# Patient Record
Sex: Female | Born: 1990 | Marital: Single | Smoking: Former smoker
Health system: Southern US, Community
[De-identification: ages and names within clinical notes are randomized; demographics above are authoritative.]

## PROBLEM LIST (undated history)

## (undated) DIAGNOSIS — R87629 Unspecified abnormal cytological findings in specimens from vagina: Secondary | ICD-10-CM

## (undated) HISTORY — PX: NO PAST SURGERIES: SHX2092

---

## 2012-05-03 DIAGNOSIS — R87629 Unspecified abnormal cytological findings in specimens from vagina: Secondary | ICD-10-CM

## 2012-05-03 HISTORY — DX: Unspecified abnormal cytological findings in specimens from vagina: R87.629

## 2012-12-28 ENCOUNTER — Ambulatory Visit (INDEPENDENT_AMBULATORY_CARE_PROVIDER_SITE_OTHER): Payer: BC Managed Care – PPO | Admitting: Family Medicine

## 2012-12-28 VITALS — BP 98/66 | HR 91 | Temp 98.9°F | Resp 18 | Ht 60.0 in | Wt 98.0 lb

## 2012-12-28 DIAGNOSIS — Z0000 Encounter for general adult medical examination without abnormal findings: Secondary | ICD-10-CM

## 2012-12-28 DIAGNOSIS — N390 Urinary tract infection, site not specified: Secondary | ICD-10-CM

## 2012-12-28 DIAGNOSIS — IMO0002 Reserved for concepts with insufficient information to code with codable children: Secondary | ICD-10-CM

## 2012-12-28 DIAGNOSIS — Z3009 Encounter for other general counseling and advice on contraception: Secondary | ICD-10-CM

## 2012-12-28 LAB — POCT CBC
Hemoglobin: 15.2 g/dL (ref 12.2–16.2)
Lymph, poc: 4.2 — AB (ref 0.6–3.4)
MCH, POC: 28.1 pg (ref 27–31.2)
MCHC: 31.5 g/dL — AB (ref 31.8–35.4)
MCV: 89.1 fL (ref 80–97)
MID (cbc): 0.8 (ref 0–0.9)
RBC: 5.41 M/uL (ref 4.04–5.48)
WBC: 10.8 10*3/uL — AB (ref 4.6–10.2)

## 2012-12-28 LAB — POCT URINALYSIS DIPSTICK
Blood, UA: NEGATIVE
Glucose, UA: NEGATIVE
Ketones, UA: NEGATIVE
Spec Grav, UA: 1.025

## 2012-12-28 LAB — COMPREHENSIVE METABOLIC PANEL
ALT: 11 U/L (ref 0–35)
Albumin: 4.7 g/dL (ref 3.5–5.2)
CO2: 26 mEq/L (ref 19–32)
Calcium: 9.8 mg/dL (ref 8.4–10.5)
Chloride: 99 mEq/L (ref 96–112)
Potassium: 3.5 mEq/L (ref 3.5–5.3)
Sodium: 139 mEq/L (ref 135–145)
Total Protein: 8.7 g/dL — ABNORMAL HIGH (ref 6.0–8.3)

## 2012-12-28 MED ORDER — NORGESTIM-ETH ESTRAD TRIPHASIC 0.18/0.215/0.25 MG-25 MCG PO TABS: 1.0000 | ORAL_TABLET | Freq: Every day | ORAL | Status: DC

## 2012-12-28 NOTE — Progress Notes (Signed)
Subjective:    Patient ID: Kathy Johnston, female    DOB: 1991-04-27, 22 y.o.   MRN: [id number] Chief Complaint  Patient presents with  . Annual Exam    pap    HPI  This is her first pap smear.  Is not birth control but is sexually active.  Using condoms occasionally.  5 partners in past year and about 10 in her lifetime.   Has been on OCPs in the past but not good about remembering to take them.  Doesn't like needles so - despite tattoos - really doesn't want to get Depo.  Has not tried setting an alarm on her phone with the pill. Last STD test was a yr ago. NO vitamins or supplement. Has had all 3 HPV vaccines.  History reviewed. No pertinent past medical history. No current outpatient prescriptions on file prior to visit.   No current facility-administered medications on file prior to visit.   No Known Allergies History reviewed. No pertinent past surgical history. History reviewed. No pertinent family history. History   Social History  . Marital Status: Single    Spouse Name: N/A    Number of Children: N/A  . Years of Education: N/A   Social History Main Topics  . Smoking status: Never Smoker   . Smokeless tobacco: None  . Alcohol Use: Yes  . Drug Use: No  . Sexual Activity: None   Other Topics Concern  . None   Social History Narrative  . None       Review of Systems  Constitutional: Negative for fever, chills, diaphoresis, activity change, appetite change, fatigue and unexpected weight change.  Gastrointestinal: Negative for abdominal pain, diarrhea, constipation, blood in stool, anal bleeding and rectal pain.  Genitourinary: Negative for dysuria, urgency, frequency, hematuria, decreased urine volume, vaginal bleeding, vaginal discharge, difficulty urinating, genital sores, vaginal pain, menstrual problem, pelvic pain and dyspareunia.  Musculoskeletal: Negative for gait problem.  Skin: Negative for rash.  Hematological: Negative for adenopathy.   Psychiatric/Behavioral: The patient is not nervous/anxious.       BP 98/66  Pulse 91  Temp(Src) 98.9 F (37.2 C) (Oral)  Resp 18  Ht 5' (1.524 m)  Wt 98 lb (44.453 kg)  BMI 19.14 kg/m2  SpO2 99%  LMP 12/22/2012 Objective:   Physical Exam  Constitutional: She is oriented to person, place, and time. She appears well-developed and well-nourished. No distress.  HENT:  Head: Normocephalic and atraumatic.  Right Ear: Tympanic membrane, external ear and ear canal normal.  Left Ear: Tympanic membrane, external ear and ear canal normal.  Nose: Mucosal edema present. No rhinorrhea.  Mouth/Throat: Uvula is midline, oropharynx is clear and moist and mucous membranes are normal. No posterior oropharyngeal erythema.  Eyes: Conjunctivae and EOM are normal. Pupils are equal, round, and reactive to light. Right eye exhibits no discharge. Left eye exhibits no discharge. No scleral icterus.  Neck: Normal range of motion. Neck supple. No thyromegaly present.  Cardiovascular: Normal rate, regular rhythm, normal heart sounds and intact distal pulses.   Pulmonary/Chest: Effort normal and breath sounds normal. No respiratory distress.  Abdominal: Soft. Bowel sounds are normal. There is no tenderness.  Genitourinary: Vagina normal and uterus normal. No breast swelling, tenderness, discharge or bleeding. Cervix exhibits no motion tenderness and no friability. Right adnexum displays no mass and no tenderness. Left adnexum displays no mass and no tenderness.  Musculoskeletal: She exhibits no edema.  Lymphadenopathy:    She has no cervical adenopathy.  Neurological: She is  alert and oriented to person, place, and time. She has normal reflexes.  Skin: Skin is warm and dry. She is not diaphoretic. No erythema.  Psychiatric: She has a normal mood and affect. Her behavior is normal.          Results for orders placed in visit on 12/28/12  POCT CBC      Result Value Range   WBC 10.8 (*) 4.6 - 10.2 K/uL    Lymph, poc 4.2 (*) 0.6 - 3.4   POC LYMPH PERCENT 38.8  10 - 50 %L   MID (cbc) 0.8  0 - 0.9   POC MID % 7.5  0 - 12 %M   POC Granulocyte 5.8  2 - 6.9   Granulocyte percent 53.7  37 - 80 %G   RBC 5.41  4.04 - 5.48 M/uL   Hemoglobin 15.2  12.2 - 16.2 g/dL   HCT, POC 11.9 (*) 14.7 - 47.9 %   MCV 89.1  80 - 97 fL   MCH, POC 28.1  27 - 31.2 pg   MCHC 31.5 (*) 31.8 - 35.4 g/dL   RDW, POC 82.9     Platelet Count, POC 249  142 - 424 K/uL   MPV 9.9  0 - 99.8 fL  POCT URINE PREGNANCY      Result Value Range   Preg Test, Ur Negative    POCT URINALYSIS DIPSTICK      Result Value Range   Color, UA yellow     Clarity, UA cloudy     Glucose, UA neg     Bilirubin, UA neg     Ketones, UA neg     Spec Grav, UA 1.025     Blood, UA neg     pH, UA 7.0     Protein, UA neg     Urobilinogen, UA 0.2     Nitrite, UA positive     Leukocytes, UA Trace      Assessment & Plan:  Routine general medical examination at a health care facility - Plan: Pap IG, CT/NG w/ reflex HPV when ASC-U, POCT CBC, POCT urinalysis dipstick, POCT Wet Prep with KOH, TSH, Comprehensive metabolic panel, HIV antibody, RPR, Hepatitis C antibody, HSV(herpes simplex vrs) 1+2 ab-IgG, CANCELED: POCT UA - Microscopic Only  Family planning - Plan: POCT urine pregnancy - will try pills though pt concerned about her poor compliance she is not open to IUD, Depo, or Nexplanon at this time.  UTI (urinary tract infection) - Plan: POCT UA - Microscopic Only, Urine culture  Meds ordered this encounter  Medications  . Norgestimate-Ethinyl Estradiol Triphasic (ORTHO TRI-CYCLEN LO) 0.18/0.215/0.25 MG-25 MCG tab    Sig: Take 1 tablet by mouth daily.    Dispense:  1 Package    Refill:  11

## 2012-12-28 NOTE — Patient Instructions (Signed)
Keeping You Healthy  Get These Tests 1. Blood Pressure- Have your blood pressure checked once a year by your health care provider.  Normal blood pressure is 120/80. 2. Weight- Have your body mass index (BMI) calculated to screen for obesity.  BMI is measure of body fat based on height and weight.  You can also calculate your own BMI at [email or web address]-[email or web address] 3. Cholesterol- Have your cholesterol checked every 5 years starting at age 23 then yearly starting at age 2. 4. Chlamydia, HIV, and other sexually transmitted diseases- Get screened every year until age 45, then within three months of each new sexual provider. 5. Pap Smear- Every 1-3 years; discuss with your health care provider. 6. Mammogram- Every year starting at age 30  Take these medicines  Calcium with Vitamin D-Your body needs 1200 mg of Calcium each day and [phone] IU of Vitamin D daily.  Your body can only absorb 500 mg of Calcium at a time so Calcium must be taken in 2 or 3 divided doses throughout the day.  Multivitamin with folic acid- Once daily if it is possible for you to become pregnant.  Get these Immunizations  Gardasil-Series of three doses; prevents HPV related illness such as genital warts and cervical cancer.  Menactra-Single dose; prevents meningitis.  Tetanus shot- Every 10 years.  Flu shot-Every year.  Take these steps 1. Do not smoke-Your healthcare provider can help you quit.  For tips on how to quit go to www.smokefree.gov or call 1-800 QUITNOW. 2. Be physically active- Exercise 5 days a week for at least 30 minutes.  If you are not already physically active, start slow and gradually work up to 30 minutes of moderate physical activity.  Examples of moderate activity include walking briskly, dancing, swimming, bicycling, etc. 3. Breast Cancer- A self breast exam every month is important for early detection of breast cancer.  For more information and instruction on self breast exams, ask your  healthcare provider or [email or web address]. 4. Eat a healthy diet- Eat a variety of healthy foods such as fruits, vegetables, whole grains, low fat milk, low fat cheeses, yogurt, lean meats, poultry and fish, beans, nuts, tofu, etc.  For more information go to www. Thenutritionsource.org 5. Drink alcohol in moderation- Limit alcohol intake to one drink or less per day. Never drink and drive. 6. Depression- Your emotional health is as important as your physical health.  If you're feeling down or losing interest in things you normally enjoy please talk to your healthcare provider about being screened for depression. 7. Dental visit- Brush and floss your teeth twice daily; visit your dentist twice a year. 8. Eye doctor- Get an eye exam at least every 2 years. 9. Helmet use- Always wear a helmet when riding a bicycle, motorcycle, rollerblading or skateboarding. 10. Safe sex- If you may be exposed to sexually transmitted infections, use a condom. 11. Seat belts- Seat belts can save your live; always wear one. 12. Smoke/Carbon Monoxide detectors- These detectors need to be installed on the appropriate level of your home. Replace batteries at least once a year. 13. Skin cancer- When out in the sun please cover up and use sunscreen 15 SPF or higher. 14. Violence- If anyone is threatening or hurting you, please tell your healthcare provider.         Contraception Choices Birth control (contraception) can stop pregnancy from happening. Different types of birth control work in different ways. Some can:  Make the mucus in the cervix thick. This makes it  hard for sperm to get into the uterus.  Thin the lining of the uterus. This makes it hard for an egg to attach to the wall of the uterus.  Stop the ovaries from releasing an egg.  Block the sperm from reaching the egg. Certain types of surgery can stop pregnancy from happening. For women, the sugery closes the fallopian  tubes (tubal ligation). For men, the surgery stops sperm from releasing during sex (vasectomy). HORMONAL BIRTH CONTROL Hormonal birth control stops pregnancy by putting hormones into your body. Types of birth control include:  A small tube put under the skin of the upper arm (implant). The tube can stay in place for 3 years.  Shots given every 3 months.  Pills taken every day or once after sex (intercourse).  Patches that are changed once a week.  A ring put into the vagina (vaginal ring). The ring is left in place for 3 weeks and removed for 1 week. Then, a new ring is put in the vagina. BARRIER BIRTH CONTROL  Barrier birth control blocks sperm from reaching the egg. Types of birth control include:   A thin covering worn on the penis (female condom) during sex.  A soft, loose covering put into the vagina (female condom) before sex.  A rubber bowl that sits over the cervix (diaphragm). The bowl must be made for you. The bowl is put into the vagina before sex. The bowl is left in place for 6 to 8 hours after sex.  A small, soft cup that fits over the cervix (cervical cap). The cup must be made for you. The cup can be left in place for 48 hours after sex.  A sponge that is put into the vagina before sex.  A chemical that kills or blocks sperm from getting into the cervix and uterus (spermicide). The chemical may be a cream, jelly, foam, or pill. INTRAUTERINE (IUD) BIRTH CONTROL  IUD birth control is a small, T-shaped piece of plastic. The plastic is put inside the uterus. There are 2 types of IUD:  Copper IUD. The IUD is covered in copper wire. The copper makes a fluid that kills sperm. It can stay in place for 10 years.  Hormone IUD. The hormone stops pregnancy from happening. It can stay in place for 5 years. NATURAL FAMILY PLANNING BIRTH CONTROL  Natural family planning means not having sex or using barrier birth control when the woman is fertile. A woman can:  Use a calendar to  keep track of when she is fertile.  Use a thermometer to measure her body temperature. Protect yourself against sexual diseases no matter what type of birth control you use. Talk to your doctor about which type of birth control is best for you. Document Released: 02/14/2009 Document Revised: 07/12/2011 Document Reviewed: 08/26/2010 [facility] Patient Information 2014 [place], [place]. Oral Contraception Information Oral contraceptives (OCs) are medicines taken to prevent pregnancy. OCs work by preventing the ovaries from releasing eggs. The hormones in OCs also cause the cervical mucus to thicken, preventing the sperm from entering the uterus. The hormones also cause the uterine lining to become thin, not allowing a fertilized egg to attach to the inside of the uterus. OCs are highly effective when taken exactly as prescribed. However, OCs do not prevent sexually transmitted diseases (STDs). Safe sex practices, such as using condoms along with the pill, can help prevent STDs.  Before taking the pill, you may have a physical exam and Pap test. Your caregiver may order  blood tests that may be necessary. Your caregiver will make sure you are a good candidate for oral contraception. Discuss with your caregiver the possible side effects of the OC you may be prescribed. When starting an OC, it can take 2 to 3 months for the body to adjust to the changes in hormone levels in your body.  TYPES OF ORAL CONTRACEPTION  The combination pill. This pill contains estrogen and progestin (synthetic progesterone) hormones. The combination pill comes in either 21-day or 28-day packs. With 21-day packs, you do not take pills for 7 days after the last pill. With 28-day packs, the pill is taken every day. The last 7 pills are without hormones. Certain types of pills have more than 21 hormone-containing pills.  The minipill. This pill contains the progesterone hormone only. It is taken every day continuously. The minipill comes  in packs of 91 pills. The first 84 pills contain the hormones, and the last 7 pills do not. The last 7 days are when you will have your menstrual period. You may experience irregular spotting. ADVANTAGES  Decreases premenstrual symptoms.  Treats menstrual period cramps.  Regulates the menstrual cycle.  Decreases a heavy menstrual flow.  Treats acne.  Treats abnormal uterine bleeding.  Treats chronic pelvic pain.  Treats polycystic ovarian syndrome.  Treats endometriosis.  Can be used as emergency contraception. DISADVANTAGES OCs can be less effective if:  You forget to take the pill at the same time every day.  You have a stomach or intestinal disease that lessens the absorption of the pill.  You take OCs with other medicines that make OCs less effective.  You take expired OCs.  You forget to restart the pill on day 7, when using the packs of 21 pills. Document Released: 07/10/2002 Document Revised: 07/12/2011 Document Reviewed: 08/26/2010 [facility] Patient Information 2014 [place], [place]. Oral Contraception Use Oral contraceptives (OCs) are medicines taken to prevent pregnancy. OCs work by preventing the ovaries from releasing eggs. The hormones in OCs also cause the cervical mucus to thicken, preventing the sperm from entering the uterus. The hormones also cause the uterine lining to become thin, not allowing a fertilized egg to attach to the inside of the uterus. OCs are highly effective when taken exactly as prescribed. However, OCs do not prevent sexually transmitted diseases (STDs). Safe sex practices, such as using condoms along with an OC, can help prevent STDs.  Before taking OCs, you may have a physical exam and Pap test. Your caregiver may also order blood tests if necessary. Your caregiver will make sure you are a good candidate for oral contraception. Discuss with your caregiver the possible side effects of the OC you may be prescribed. When starting an OC, it can  take 2 to 3 months for the body to adjust to the changes in hormone levels in your body.  HOW TO TAKE ORAL CONTRACEPTIVES Your caregiver may advise you on how to start taking the first cycle of OCs. Otherwise, you can:  Start on day 1 of your menstrual period. You will not need any backup contraceptive protection with this start time.  Start on the first Sunday after your menstrual period or the day you get your prescription. In these cases, you will need to use backup contraceptive protection for the first 7-day cycle. After you have started taking OCs:  If you forget to take 1 pill, take it as soon as you remember. Take the next pill at the regular time.  If you miss 2  or more pills, use backup birth control until your next menstrual period starts.  If you use a 28-day pack that contains inactive pills and you miss 1 of the last 7 pills (pills with no hormones), it will not matter. Throw away the rest of the non-hormone pills and start a new pill pack. No matter which day you start the OC, you will always start a new pack on that same day of the week. Have an extra pack of OCs and a backup contraceptive method available in case you miss some pills or lose your OC pack. HOME CARE INSTRUCTIONS   Do not smoke.  Always use a condom to protect against STDs. OCs do not protect against STDs.  Use a calendar to mark your menstrual period days.  Read the information and directions that come with your OC. Talk to your caregiver if you have questions. SEEK MEDICAL CARE IF:   You develop nausea and vomiting.  You have abnormal vaginal discharge or bleeding.  You develop a rash.  You miss your menstrual period.  You are losing your hair.  You need treatment for mood swings or depression.  You get dizzy when taking the OC.  You develop acne from taking the OC.  You become pregnant. SEEK IMMEDIATE MEDICAL CARE IF:   You develop chest pain.  You develop shortness of breath.  You  have an uncontrolled or severe headache.  You develop numbness or slurred speech.  You develop visual problems.  You develop pain, redness, and swelling in the legs. Document Released: 04/08/2011 Document Revised: 07/12/2011 Document Reviewed: 04/08/2011 [facility] Patient Information 2014 [place], [place].

## 2012-12-29 LAB — HIV ANTIBODY (ROUTINE TESTING W REFLEX): HIV: NONREACTIVE

## 2012-12-29 LAB — HSV(HERPES SIMPLEX VRS) I + II AB-IGG
HSV 1 Glycoprotein G Ab, IgG: 0.4 IV
HSV 2 Glycoprotein G Ab, IgG: <0.1 IV

## 2012-12-29 LAB — TSH: TSH: 3.018 u[IU]/mL (ref 0.350–4.500)

## 2012-12-29 LAB — HEPATITIS C ANTIBODY: HCV Ab: NEGATIVE

## 2012-12-30 MED ORDER — NITROFURANTOIN MONOHYD MACRO 100 MG PO CAPS: 100.0000 mg | ORAL_CAPSULE | Freq: Two times a day (BID) | ORAL | Status: DC

## 2012-12-30 NOTE — Addendum Note (Signed)
Addended by: Norberto Sorenson on: 12/30/2012 12:46 PM   Modules accepted: Orders

## 2013-01-03 DIAGNOSIS — IMO0002 Reserved for concepts with insufficient information to code with codable children: Secondary | ICD-10-CM | POA: Insufficient documentation

## 2013-01-03 LAB — PAP IG, CT-NG, RFX HPV ASCU: GC Probe Amp: NEGATIVE

## 2013-04-02 ENCOUNTER — Ambulatory Visit (INDEPENDENT_AMBULATORY_CARE_PROVIDER_SITE_OTHER): Payer: BC Managed Care – PPO | Admitting: *Deleted

## 2013-04-02 DIAGNOSIS — O3680X0 Pregnancy with inconclusive fetal viability, not applicable or unspecified: Secondary | ICD-10-CM

## 2013-04-02 DIAGNOSIS — Z3201 Encounter for pregnancy test, result positive: Secondary | ICD-10-CM

## 2013-04-02 NOTE — Progress Notes (Signed)
Pt is undecided what to do concerning pregnancy,  Ultrasound scheduled for 12/4 at 1415.  Pt aware.

## 2013-04-02 NOTE — Progress Notes (Signed)
Pt

## 2013-04-05 ENCOUNTER — Ambulatory Visit (HOSPITAL_COMMUNITY): Payer: BC Managed Care – PPO

## 2013-05-21 ENCOUNTER — Inpatient Hospital Stay (HOSPITAL_COMMUNITY): Payer: BC Managed Care – PPO

## 2013-05-21 ENCOUNTER — Inpatient Hospital Stay (HOSPITAL_COMMUNITY)
Admission: AD | Admit: 2013-05-21 | Discharge: 2013-05-21 | Disposition: A | Discharge status: Home / Self Care | Payer: BC Managed Care – PPO | Source: Ambulatory Visit | Attending: Obstetrics & Gynecology | Admitting: Obstetrics & Gynecology

## 2013-05-21 ENCOUNTER — Encounter (HOSPITAL_COMMUNITY): Payer: Self-pay

## 2013-05-21 DIAGNOSIS — R102 Pelvic and perineal pain: Secondary | ICD-10-CM

## 2013-05-21 DIAGNOSIS — O99891 Other specified diseases and conditions complicating pregnancy: Secondary | ICD-10-CM | POA: Insufficient documentation

## 2013-05-21 DIAGNOSIS — O9989 Other specified diseases and conditions complicating pregnancy, childbirth and the puerperium: Principal | ICD-10-CM

## 2013-05-21 DIAGNOSIS — N949 Unspecified condition associated with female genital organs and menstrual cycle: Secondary | ICD-10-CM | POA: Insufficient documentation

## 2013-05-21 DIAGNOSIS — O26859 Spotting complicating pregnancy, unspecified trimester: Secondary | ICD-10-CM | POA: Insufficient documentation

## 2013-05-21 DIAGNOSIS — O26899 Other specified pregnancy related conditions, unspecified trimester: Secondary | ICD-10-CM

## 2013-05-21 DIAGNOSIS — R109 Unspecified abdominal pain: Secondary | ICD-10-CM | POA: Insufficient documentation

## 2013-05-21 HISTORY — DX: Unspecified abnormal cytological findings in specimens from vagina: R87.629

## 2013-05-21 LAB — CBC
HCT: 39.9 % (ref 36.0–46.0)
HEMOGLOBIN: 13.4 g/dL (ref 12.0–15.0)
MCH: 27.9 pg (ref 26.0–34.0)
MCHC: 33.6 g/dL (ref 30.0–36.0)
MCV: 83 fL (ref 78.0–100.0)
Platelets: 169 10*3/uL (ref 150–400)
RBC: 4.81 MIL/uL (ref 3.87–5.11)
RDW: 14.1 % (ref 11.5–15.5)
WBC: 10.2 10*3/uL (ref 4.0–10.5)

## 2013-05-21 LAB — TYPE AND SCREEN
ABO/RH(D): O POS
ANTIBODY SCREEN: NEGATIVE

## 2013-05-21 LAB — HCG, QUANTITATIVE, PREGNANCY: HCG, BETA CHAIN, QUANT, S: 6538 m[IU]/mL — AB (ref <5)

## 2013-05-21 LAB — ABO/RH: ABO/RH(D): O POS

## 2013-05-21 NOTE — Discharge Instructions (Signed)
Ectopic Pregnancy °An ectopic pregnancy is when the fertilized egg attaches (implants) outside the uterus. Most ectopic pregnancies occur in the fallopian tube. Rarely do ectopic pregnancies occur on the ovary, intestine, pelvis, or cervix. In an ectopic pregnancy, the fertilized egg does not have the ability to develop into a normal, healthy baby.  °A ruptured ectopic pregnancy is one in which the fallopian tube gets torn or bursts and results in internal bleeding. Often there is intense abdominal pain, and sometimes, vaginal bleeding. Having an ectopic pregnancy can be life threatening. If left untreated, this dangerous condition can lead to a blood transfusion, abdominal surgery, or even death. °CAUSES  °Damage to the fallopian tubes is the suspected cause in most ectopic pregnancies.  °RISK FACTORS °Depending on your circumstances, the risk of having an ectopic pregnancy will vary. The level of risk can be divided into three categories. °High Risk °· You have gone through infertility treatment. °· You have had a previous ectopic pregnancy. °· You have had previous tubal surgery. °· You have had previous surgery to have the fallopian tubes tied (tubal ligation). °· You have tubal problems or diseases. °· You have been exposed to DES. DES is a medicine that was used until 1971 and had effects on babies whose mothers took the medicine. °· You become pregnant while using an intrauterine device (IUD) for birth control.  °Moderate Risk °· You have a history of infertility. °· You have a history of a sexually transmitted infection (STI). °· You have a history of pelvic inflammatory disease (PID). °· You have scarring from endometriosis. °· You have multiple sexual partners. °· You smoke.  °Low Risk °· You have had previous pelvic surgery. °· You use vaginal douching. °· You became sexually active before 24 years of age. °SIGNS AND SYMPTOMS  °An ectopic pregnancy should be suspected in anyone who has missed a period and  has abdominal pain or bleeding. °· You may experience normal pregnancy symptoms, such as: °· Nausea. °· Tiredness. °· Breast tenderness. °· Other symptoms may include: °· Pain with intercourse. °· Irregular vaginal bleeding or spotting. °· Cramping or pain on one side or in the lower abdomen. °· Fast heartbeat. °· Passing out while having a bowel movement. °· Symptoms of a ruptured ectopic pregnancy and internal bleeding may include: °· Sudden, severe pain in the abdomen and pelvis. °· Dizziness or fainting. °· Pain in the shoulder area. °DIAGNOSIS  °Tests that may be performed include: °· A pregnancy test. °· An ultrasound test. °· Testing the specific level of pregnancy hormone in the bloodstream. °· Taking a sample of uterus tissue (dilation and curettage, D&C). °· Surgery to perform a visual exam of the inside of the abdomen using a thin, lighted tube with a tiny camera on the end (laparoscope). °TREATMENT  °An injection of a medicine called methotrexate may be given. This medicine causes the pregnancy tissue to be absorbed. It is given if: °· The diagnosis is made early. °· The fallopian tube has not ruptured. °· You are considered to be a good candidate for the medicine. °Usually, pregnancy hormone blood levels are checked after methotrexate treatment. This is to be sure the medicine is effective. It may take 4 6 weeks for the pregnancy to be absorbed (though most pregnancies will be absorbed by 3 weeks). °Surgical treatment may be needed. A laparoscope may be used to remove the pregnancy tissue. If severe internal bleeding occurs, a cut (incision) may be made in the lower abdomen (laparotomy), and the   ectopic pregnancy is removed. This stops the bleeding. Part of the fallopian tube, or the whole tube, may be removed as well (salpingectomy). After surgery, pregnancy hormone tests may be done to be sure there is no pregnancy tissue left. You may receive an Rho(D) immune globulin shot if you are Rh negative and  the father is Rh positive, or if you do not know the Rh type of the father. This is to prevent problems with any future pregnancy. °SEEK IMMEDIATE MEDICAL CARE IF:  °You have any symptoms of an ectopic pregnancy. This is a medical emergency. °Document Released: 05/27/2004 Document Revised: 02/07/2013 Document Reviewed: 11/16/2012 °[employer]® Patient Information ©2014 [employer]. ° °

## 2013-05-21 NOTE — MAU Note (Signed)
Bright red vaginal bleeding & lower abdominal cramping since just before midnight tonight. States has been getting prenatal care at [facility] department, last seen 2 weeks ago.

## 2013-05-21 NOTE — MAU Provider Note (Signed)
History     CSN: [id number]  Arrival date and time: 05/21/13 0358   None     Chief Complaint  Patient presents with  . Vaginal Bleeding  . Abdominal Pain   Vaginal Bleeding Associated symptoms include abdominal pain.  Abdominal Pain    Kathy Johnston is a 23 y.o. G1P0 at [redacted]w[redacted]d who presents today with cramping. She states that she has had some bleeding. She states it was spotting for the last two days and then at 0000 she started to have heavier bleeding. She states that the bleeding was like a period, but she had passed some clots. She denies seeing any tissue. She was seen at the HD about 2 weeks ago, and they were unable to get FHT. She was told that she was too early at that time.   Past Medical History  Diagnosis Date  . Vaginal Pap smear, abnormal 2014    Past Surgical History  Procedure Laterality Date  . No past surgeries      History reviewed. No pertinent family history.  History  Substance Use Topics  . Smoking status: Never Smoker   . Smokeless tobacco: Not on file  . Alcohol Use: No    Allergies: No Known Allergies  Prescriptions prior to admission  Medication Sig Dispense Refill  . acetaminophen (TYLENOL) 500 MG tablet Take 500 mg by mouth every 6 (six) hours as needed for moderate pain.      . Prenatal Vit-Fe Fumarate-FA (PRENATAL MULTIVITAMIN) TABS tablet Take 1 tablet by mouth daily at 12 noon.        Review of Systems  Gastrointestinal: Positive for abdominal pain.  Genitourinary: Positive for vaginal bleeding.   Physical Exam   Blood pressure 105/63, pulse 100, temperature 99.5 F (37.5 C), temperature source Oral, resp. rate 18, height 5\' 1"  (1.549 m), weight 46.72 kg (103 lb), last menstrual period 12/22/2012, SpO2 100.00%.  Physical Exam  Nursing note and vitals reviewed. Constitutional: She is oriented to person, place, and time. She appears well-developed and well-nourished. No distress.  Cardiovascular: Normal rate.    Respiratory: Effort normal.  GI: Soft. There is no tenderness.  Neurological: She is alert and oriented to person, place, and time.  Skin: Skin is warm and dry.  Psychiatric: She has a normal mood and affect.   Unable to auscultate FHT with doppler.   MAU Course  Procedures  Results for orders placed during the hospital encounter of 05/21/13 (from the past 24 hour(s))  CBC     Status: None   Collection Time    05/21/13  4:25 AM      Result Value Range   WBC 10.2  4.0 - 10.5 K/uL   RBC 4.81  3.87 - 5.11 MIL/uL   Hemoglobin 13.4  12.0 - 15.0 g/dL   HCT 40.9  81.1 - 91.4 %   MCV 83.0  78.0 - 100.0 fL   MCH 27.9  26.0 - 34.0 pg   MCHC 33.6  30.0 - 36.0 g/dL   RDW 78.2  95.6 - 21.3 %   Platelets 169  150 - 400 K/uL  TYPE AND SCREEN     Status: None   Collection Time    05/21/13  4:25 AM      Result Value Range   ABO/RH(D) O POS     Antibody Screen NEG     Sample Expiration 05/24/2013    HCG, QUANTITATIVE, PREGNANCY     Status: Abnormal   Collection Time  05/21/13  4:25 AM      Result Value Range   hCG, Beta Chain, Quant, S 6538 (*) <5 mIU/mL  ABO/RH     Status: None   Collection Time    05/21/13  4:25 AM      Result Value Range   ABO/RH(D) O POS       US Ob Comp Less 14 Wks  05/21/2013   CLINICAL DATA:  Vaginal bleeding and pelvic pain.  EXAM: OBSTETRIC <14 WK [place] AND TRANSVAGINAL OB US  TECHNIQUE: Both transabdominal and transvaginal ultrasound examinations were performed for complete evaluation of the gestation as well as the maternal uterus, adnexal regions, and pelvic cul-de-sac. Transvaginal technique was performed to assess early pregnancy.  COMPARISON:  None.  FINDINGS: Intrauterine gestational sac: None seen.  Yolk sac:  N/A  Embryo:  N/A  Maternal uterus/adnexae: The endometrial echo complex appears somewhat thickened and heterogeneous, with clumping of echogenic tissue at the fundus. This may simply reflect clot, as no definite associated blood flow is seen on  limited Doppler evaluation.  The ovaries are unremarkable in appearance. The right ovary measures 1.9 x 1.6 x 1.8 cm, while the left ovary measures 3.0 x 1.1 x 1.7 cm. No suspicious adnexal masses are seen; there is no evidence for ovarian torsion.  No free fluid is seen within the pelvic cul-de-sac.  IMPRESSION: No intrauterine gestational sac seen. The endometrial echo complex appears somewhat thickened and heterogeneous, with clumping of echogenic tissue at the fundus. This may simply reflect clot, as no associated blood flow is seen. If the patient's symptoms persist, further evaluation could be considered.  Would follow-up quantitative beta HCG levels; if levels continue to rise, follow-up pelvic ultrasound would be warranted.   Electronically Signed   By: Roanna Raider M.D.   On: 05/21/2013 05:22   US Ob Transvaginal  05/21/2013   CLINICAL DATA:  Vaginal bleeding and pelvic pain.  EXAM: OBSTETRIC <14 WK [place] AND TRANSVAGINAL OB US  TECHNIQUE: Both transabdominal and transvaginal ultrasound examinations were performed for complete evaluation of the gestation as well as the maternal uterus, adnexal regions, and pelvic cul-de-sac. Transvaginal technique was performed to assess early pregnancy.  COMPARISON:  None.  FINDINGS: Intrauterine gestational sac: None seen.  Yolk sac:  N/A  Embryo:  N/A  Maternal uterus/adnexae: The endometrial echo complex appears somewhat thickened and heterogeneous, with clumping of echogenic tissue at the fundus. This may simply reflect clot, as no definite associated blood flow is seen on limited Doppler evaluation.  The ovaries are unremarkable in appearance. The right ovary measures 1.9 x 1.6 x 1.8 cm, while the left ovary measures 3.0 x 1.1 x 1.7 cm. No suspicious adnexal masses are seen; there is no evidence for ovarian torsion.  No free fluid is seen within the pelvic cul-de-sac.  IMPRESSION: No intrauterine gestational sac seen. The endometrial echo complex appears somewhat  thickened and heterogeneous, with clumping of echogenic tissue at the fundus. This may simply reflect clot, as no associated blood flow is seen. If the patient's symptoms persist, further evaluation could be considered.  Would follow-up quantitative beta HCG levels; if levels continue to rise, follow-up pelvic ultrasound would be warranted.   Electronically Signed   By: Roanna Raider M.D.   On: 05/21/2013 05:22     Assessment and Plan   1. Pelvic pain complicating pregnancy    Cannot exclude ectopic at this time Bleeding precautions Ectopic precautions  Follow-up Information   [facility] In 2 days.   Contact information:   [address] [id number] [place] [id number] [phone]       Tawnya CrookHogan, Jacobs Golab Donovan 05/21/2013, 5:25 AM

## 2013-05-23 ENCOUNTER — Inpatient Hospital Stay (HOSPITAL_COMMUNITY)
Admission: AD | Admit: 2013-05-23 | Discharge: 2013-05-23 | Disposition: A | Discharge status: Home / Self Care | Payer: BC Managed Care – PPO | Source: Ambulatory Visit | Attending: Obstetrics and Gynecology | Admitting: Obstetrics and Gynecology

## 2013-05-23 DIAGNOSIS — O039 Complete or unspecified spontaneous abortion without complication: Secondary | ICD-10-CM

## 2013-05-23 LAB — HCG, QUANTITATIVE, PREGNANCY: hCG, Beta Chain, Quant, S: 1708 m[IU]/mL — ABNORMAL HIGH (ref <5)

## 2013-05-23 NOTE — Discharge Instructions (Signed)
Incomplete Miscarriage °A miscarriage is the sudden loss of an unborn baby (fetus) before the 20th week of pregnancy. In an incomplete miscarriage, parts of the fetus or placenta (afterbirth) remain in the body.  °Having a miscarriage can be an emotional experience. Talk with your health care provider about any questions you may have about miscarrying, the grieving process, and your future pregnancy plans. °CAUSES  °· Problems with the fetal chromosomes that make it impossible for the baby to develop normally. Problems with the baby's genes or chromosomes are most often the result of errors that occur by chance as the embryo divides and grows. The problems are not inherited from the parents. °· Infection of the cervix or uterus. °· Hormone problems. °· Problems with the cervix, such as having an incompetent cervix. This is when the tissue in the cervix is not strong enough to hold the pregnancy. °· Problems with the uterus, such as an abnormally shaped uterus, uterine fibroids, or congenital abnormalities. °· Certain medical conditions. °· Smoking, drinking alcohol, or taking illegal drugs. °· Trauma. °SYMPTOMS  °· Vaginal bleeding or spotting, with or without cramps or pain. °· Pain or cramping in the abdomen or lower back. °· Passing fluid, tissue, or blood clots from the vagina. °DIAGNOSIS  °Your health care provider will perform a physical exam. You may also have an ultrasound to confirm the miscarriage. Blood or urine tests may also be ordered. °TREATMENT  °· Usually, a dilation and curettage (D&C) procedure is performed. During a D&C procedure, the cervix is widened (dilated) and any remaining fetal or placental tissue is gently removed from the uterus. °· Antibiotic medicines are prescribed if there is an infection. Other medicines may be given to reduce the size of the uterus (contract) if there is a lot of bleeding. °· If you have Rh negative blood and your baby was Rh positive, you will need an Rho(D)  immune globulin shot. This shot will protect any future baby from having Rh blood problems in future pregnancies. °· You may be confined to bed rest. This means you should stay in bed and only get up to use the bathroom. °HOME CARE INSTRUCTIONS  °· Rest as directed by your health care provider. °· Restrict activity as directed by your health care provider. You may be allowed to continue light activity if curettage was not done but you require further treatment. °· Keep track of the number of pads you use each day. Keep track of how soaked (saturated) they are. Record this information. °· Do not  use tampons. °· Do not douche or have sexual intercourse until approved by your health care provider. °· Keep all follow-up appointments for re-evaluation and continuing management. °· Only take over-the-counter or prescription medicines for pain, fever, or discomfort as directed by your health care provider. °· Take antibiotic medicine as directed by your health care provider. Make sure you finish it even if you start to feel better. °SEEK IMMEDIATE MEDICAL CARE IF:  °· You experience severe cramps in your stomach, back, or abdomen. °· You have an unexplained temperature (make sure to record these temperatures). °· You pass large clots or tissue (save these for your health care provider to inspect). °· Your bleeding increases. °· You become light-headed, weak, or have fainting episodes. °MAKE SURE YOU:  °· Understand these instructions. °· Will watch your condition. °· Will get help right away if you are not doing well or get worse. °Document Released: 04/19/2005 Document Revised: 02/07/2013 Document Reviewed: 11/16/2012 °  ExitCare® Patient Information ©2014 [employer]. ° °

## 2013-05-23 NOTE — MAU Provider Note (Signed)
Kathy Johnston is a 23 y.o. G1P0 at 2475w0d who presents to MAU today for follow-up quant hCG. The patient was seen on 05/21/13 with complaint of heavy vaginal bleeding. US did not show anything in the uterus at that time. Light bleeding today and no abdominal pain. Patient felt she may have passed POC on Monday after her visit in MAU.   BP 98/56  Pulse 97  Temp(Src) 98.1 F (36.7 C) (Oral)  Resp 16  LMP 12/22/2012 GENERAL: Well-developed, well-nourished female in no acute distress.  HEENT: Normocephalic, atraumatic.   LUNGS: Effort normal HEART: Regular rate  SKIN: Warm, dry and without erythema PSYCH: Normal mood and affect  Results for orders placed during the hospital encounter of 05/23/13 (from the past 24 hour(s))  HCG, QUANTITATIVE, PREGNANCY     Status: Abnormal   Collection Time    05/23/13  9:14 AM      Result Value Range   hCG, Beta Chain, Quant, S 1708 (*) <5 mIU/mL   Results for Jamelle HaringXAYYAVONG, Monicka (MRN [id number]) as of 05/23/2013 10:13  Ref. Range 04/02/2013 15:46 05/21/2013 04:25 05/21/2013 04:25 05/21/2013 05:13 05/23/2013 09:14  hCG, Beta Chain, Quant, S Latest Range: <5 mIU/mL  6538 (H)   1708 (H)   MDM Patient is stable at this time Patient declines pain medication Rx at this time.  Patient brought sample of "tissue" passed at home. Does appear to have some elements of POC.   A: SAB  P: Discharge home Bleeding precautions discussed [facility]. They will call her with an appointment Patient may return to MAU as needed  Freddi StarrJulie N Ethier, PA-C 05/23/2013 10:13 AM

## 2013-05-23 NOTE — MAU Note (Signed)
Pt here for F/U labs, denies pain, is having light bleeding today.

## 2013-05-23 NOTE — MAU Provider Note (Signed)
Attestation of Attending Supervision of Advanced Practitioner (CNM/NP): Evaluation and management procedures were performed by the Advanced Practitioner under my supervision and collaboration.  I have reviewed the Advanced Practitioner's note and chart, and I agree with the management and plan.  Domenico Achord 05/23/2013 1:10 PM   

## 2013-06-06 ENCOUNTER — Ambulatory Visit (INDEPENDENT_AMBULATORY_CARE_PROVIDER_SITE_OTHER): Payer: BC Managed Care – PPO | Admitting: Obstetrics & Gynecology

## 2013-06-06 ENCOUNTER — Encounter: Payer: Self-pay | Admitting: Obstetrics & Gynecology

## 2013-06-06 VITALS — BP 106/70 | HR 88 | Temp 97.8°F | Ht 61.0 in | Wt 101.7 lb

## 2013-06-06 DIAGNOSIS — O039 Complete or unspecified spontaneous abortion without complication: Secondary | ICD-10-CM

## 2013-06-06 NOTE — Progress Notes (Signed)
Pt. States she stopped bleeding 05/31/13. Has not had any bleeding, pain or problems since then.

## 2013-06-06 NOTE — Patient Instructions (Signed)
Miscarriage A miscarriage is the sudden loss of an unborn baby (fetus) before the 20th week of pregnancy. Most miscarriages happen in the first 3 months of pregnancy. Sometimes, it happens before a woman even knows she is pregnant. A miscarriage is also called a "spontaneous miscarriage" or "early pregnancy loss." Having a miscarriage can be an emotional experience. Talk with your caregiver about any questions you may have about miscarrying, the grieving process, and your future pregnancy plans. CAUSES   Problems with the fetal chromosomes that make it impossible for the baby to develop normally. Problems with the baby's genes or chromosomes are most often the result of errors that occur, by chance, as the embryo divides and grows. The problems are not inherited from the parents.  Infection of the cervix or uterus.   Hormone problems.   Problems with the cervix, such as having an incompetent cervix. This is when the tissue in the cervix is not strong enough to hold the pregnancy.   Problems with the uterus, such as an abnormally shaped uterus, uterine fibroids, or congenital abnormalities.   Certain medical conditions.   Smoking, drinking alcohol, or taking illegal drugs.   Trauma.  Often, the cause of a miscarriage is unknown.  SYMPTOMS   Vaginal bleeding or spotting, with or without cramps or pain.  Pain or cramping in the abdomen or lower back.  Passing fluid, tissue, or blood clots from the vagina. DIAGNOSIS  Your caregiver will perform a physical exam. You may also have an ultrasound to confirm the miscarriage. Blood or urine tests may also be ordered. TREATMENT   Sometimes, treatment is not necessary if you naturally pass all the fetal tissue that was in the uterus. If some of the fetus or placenta remains in the body (incomplete miscarriage), tissue left behind may become infected and must be removed. Usually, a dilation and curettage (D and C) procedure is performed.  During a D and C procedure, the cervix is widened (dilated) and any remaining fetal or placental tissue is gently removed from the uterus.  Antibiotic medicines are prescribed if there is an infection. Other medicines may be given to reduce the size of the uterus (contract) if there is a lot of bleeding.  If you have Rh negative blood and your baby was Rh positive, you will need a Rh immunoglobulin shot. This shot will protect any future baby from having Rh blood problems in future pregnancies. HOME CARE INSTRUCTIONS   Your caregiver may order bed rest or may allow you to continue light activity. Resume activity as directed by your caregiver.  Have someone help with home and family responsibilities during this time.   Keep track of the number of sanitary pads you use each day and how soaked (saturated) they are. Write down this information.   Do not use tampons. Do not douche or have sexual intercourse until approved by your caregiver.   Only take over-the-counter or prescription medicines for pain or discomfort as directed by your caregiver.   Do not take aspirin. Aspirin can cause bleeding.   Keep all follow-up appointments with your caregiver.   If you or your partner have problems with grieving, talk to your caregiver or seek counseling to help cope with the pregnancy loss. Allow enough time to grieve before trying to get pregnant again.  SEEK IMMEDIATE MEDICAL CARE IF:   You have severe cramps or pain in your back or abdomen.  You have a fever.  You pass large blood clots (walnut-sized   or larger) ortissue from your vagina. Save any tissue for your caregiver to inspect.   Your bleeding increases.   You have a thick, bad-smelling vaginal discharge.  You become lightheaded, weak, or you faint.   You have chills.  MAKE SURE YOU:  Understand these instructions.  Will watch your condition.  Will get help right away if you are not doing well or get  worse. Document Released: 10/13/2000 Document Revised: 08/14/2012 Document Reviewed: 06/08/2011 ExitCare Patient Information 2014 [employer].  

## 2013-06-06 NOTE — Progress Notes (Signed)
Subjective:     Patient ID: Kathy HaringNatthaya Lipari, female   DOB: 05/22/1990, 23 y.o.   MRN: [id number]  HPI  Pt here to f/u after an SAB. She denies current bleeding. This was an undesried pregnancy.  Declines birth control.  Partner incarcerated.   Review of Systems     Objective:   Physical Exam BP 106/70  Pulse 88  Temp(Src) 97.8 F (36.6 C) (Oral)  Ht 5\' 1"  (1.549 m)  Wt 101 lb 11.2 oz (46.131 kg)  BMI 19.23 kg/m2  LMP 12/22/2012  Breastfeeding? Unknown Pt in NAD Abd: soft, NT, ND  qHCG 05/23/2013  1708 Assessment:     S/p SAB.  Still with high quant.  Will repeat.  Declines contraception       Plan:     quant hcg   F/u prn

## 2013-06-07 LAB — HCG, QUANTITATIVE, PREGNANCY: hCG, Beta Chain, Quant, S: 54.5 m[IU]/mL

## 2013-06-08 ENCOUNTER — Encounter: Payer: Self-pay | Admitting: Obstetrics & Gynecology

## 2014-01-06 ENCOUNTER — Emergency Department: Payer: Self-pay | Admitting: Emergency Medicine

## 2014-01-16 ENCOUNTER — Encounter (HOSPITAL_COMMUNITY): Payer: Self-pay

## 2014-03-04 ENCOUNTER — Encounter (HOSPITAL_COMMUNITY): Payer: Self-pay

## 2014-03-26 ENCOUNTER — Encounter (HOSPITAL_COMMUNITY): Payer: Self-pay | Admitting: *Deleted

## 2014-04-17 ENCOUNTER — Ambulatory Visit (INDEPENDENT_AMBULATORY_CARE_PROVIDER_SITE_OTHER): Payer: BC Managed Care – PPO

## 2014-04-17 ENCOUNTER — Ambulatory Visit (INDEPENDENT_AMBULATORY_CARE_PROVIDER_SITE_OTHER): Payer: BC Managed Care – PPO | Admitting: Family Medicine

## 2014-04-17 VITALS — BP 108/60 | HR 81 | Temp 98.7°F | Resp 18 | Ht 62.0 in | Wt 97.0 lb

## 2014-04-17 DIAGNOSIS — K5900 Constipation, unspecified: Secondary | ICD-10-CM

## 2014-04-17 DIAGNOSIS — R1084 Generalized abdominal pain: Secondary | ICD-10-CM

## 2014-04-17 DIAGNOSIS — Z7251 High risk heterosexual behavior: Secondary | ICD-10-CM

## 2014-04-17 DIAGNOSIS — N926 Irregular menstruation, unspecified: Secondary | ICD-10-CM

## 2014-04-17 DIAGNOSIS — N910 Primary amenorrhea: Secondary | ICD-10-CM

## 2014-04-17 LAB — POCT CBC
Granulocyte percent: 60.9 %G (ref 37–80)
HCT, POC: 40.7 % (ref 37.7–47.9)
Hemoglobin: 12.9 g/dL (ref 12.2–16.2)
Lymph, poc: 2.4 (ref 0.6–3.4)
MCH, POC: 26.8 pg — AB (ref 27–31.2)
MCHC: 31.7 g/dL — AB (ref 31.8–35.4)
MCV: 84.5 fL (ref 80–97)
MID (cbc): 0.5 (ref 0–0.9)
MPV: 8.8 fL (ref 0–99.8)
POC GRANULOCYTE: 4.5 (ref 2–6.9)
POC LYMPH %: 32 % (ref 10–50)
POC MID %: 7.1 %M (ref 0–12)
Platelet Count, POC: 146 10*3/uL (ref 142–424)
RBC: 4.81 M/uL (ref 4.04–5.48)
RDW, POC: 15.2 %
WBC: 7.4 10*3/uL (ref 4.6–10.2)

## 2014-04-17 LAB — POCT URINE PREGNANCY: Preg Test, Ur: NEGATIVE

## 2014-04-17 MED ORDER — SENNOSIDES-DOCUSATE SODIUM 8.6-50 MG PO TABS: 1.0000 | ORAL_TABLET | Freq: Two times a day (BID) | ORAL | Status: DC

## 2014-04-17 NOTE — Addendum Note (Signed)
Addended by: Wallis BambergMANI, Darryll Raju on: 04/17/2014 02:03 PM   Modules accepted: Level of Service

## 2014-04-17 NOTE — Progress Notes (Signed)
MRN: [id number] DOB: 01/15/1991  Subjective:   Kathy Johnston is  a 23 y.o. female presenting for 4 day history of generalized abdominal pain. Patient states that the pain is moderate, sharp and achy, lasting several hours, without any known precipitants. She has tried using Midol without relief. Admits feeling constipated for the same time period, has hard stools, strains, feels pain with defecation, sees some blood on the toilet paper but not in the stool, tries to go 2x a day but isn't always successful. She has tried Gas X and ginger ale without any relief. Denies history of problems with constipation or hemorrhoids. She has not changed her diet recently, eats "asian food", rice, chicken, vegetables. Denies feeling masses or protrusions around anal area. Denies diarrhea, n/v, fevers, shob, chest pain, dysuria, vaginal discharge, pelvic pain, rashes (genital or otherwise). Of note, patient is sexually active with 1 female partner, does not use protection, last time at the beginning of November, last LMP was 03/13/2014, was regular. Reports that her cycle is late, admits that abdominal pain is different from her normal menstrual cramps. Denies smoking, ~4 alcohol drinks per week. Denies any other aggravating or relieving factors, no other questions or concerns.  Kathy Johnston has a current medication list which includes the following prescription(s): senna-docusate.  She has No Known Allergies.  Kathy Johnston  has a past medical history of Vaginal Pap smear, abnormal (2014). Also  has past surgical history that includes No past surgeries.  ROS As in subjective.  Objective:   Vitals: BP 108/60 mmHg  Pulse 81  Temp(Src) 98.7 F (37.1 C) (Oral)  Resp 18  Ht 5\' 2"  (1.575 m)  Wt 97 lb (43.999 kg)  BMI 17.74 kg/m2  SpO2 100%  LMP 03/19/2014  Physical Exam  Constitutional: She is oriented to person, place, and time and well-developed, well-nourished, and in no distress.  Cardiovascular: Normal  rate, regular rhythm, normal heart sounds and intact distal pulses.  Exam reveals no gallop and no friction rub.   No murmur heard. Pulmonary/Chest: Effort normal and breath sounds normal. No respiratory distress. She has no wheezes. She has no rales. She exhibits no tenderness.  Abdominal: Soft. Bowel sounds are normal. She exhibits no distension and no mass. There is tenderness (left-sided). There is no rebound and no guarding.  Musculoskeletal: Normal range of motion. She exhibits no edema or tenderness.  Neurological: She is alert and oriented to person, place, and time.  Skin: Skin is warm and dry. No rash noted. She is not diaphoretic. No erythema.  Psychiatric: Mood and affect normal.   Results for orders placed or performed in visit on 04/17/14 (from the past 24 hour(s))  POCT urine pregnancy     Status: None   Collection Time: 04/17/14 11:57 AM  Result Value Ref Range   Preg Test, Ur Negative   POCT CBC     Status: Abnormal   Collection Time: 04/17/14 12:36 PM  Result Value Ref Range   WBC 7.4 4.6 - 10.2 K/uL   Lymph, poc 2.4 0.6 - 3.4   POC LYMPH PERCENT 32.0 10 - 50 %L   MID (cbc) 0.5 0 - 0.9   POC MID % 7.1 0 - 12 %M   POC Granulocyte 4.5 2 - 6.9   Granulocyte percent 60.9 37 - 80 %G   RBC 4.81 4.04 - 5.48 M/uL   Hemoglobin 12.9 12.2 - 16.2 g/dL   HCT, POC 08.640.7 57.837.7 - 47.9 %   MCV 84.5 80 -  97 fL   MCH, POC 26.8 (A) 27 - 31.2 pg   MCHC 31.7 (A) 31.8 - 35.4 g/dL   RDW, POC 11.915.2 %   Platelet Count, POC 146 142 - 424 K/uL   MPV 8.8 0 - 99.8 fL   [facility] reading (PRIMARY) by  Dr. Patsy Lageropland and PA-Matt Delpizzo. Abd X-ray: Evidence of mild to moderate stool burden. No other acute abnormality.  Dg Abd 1 View  04/17/2014   CLINICAL DATA:  Generalized abdominal pain.  EXAM: ABDOMEN - 1 VIEW  COMPARISON:  None.  FINDINGS: The bowel gas pattern is unremarkable. A moderate stool burden in the colon is present. No free extraluminal gas collections are demonstrated. There are no abnormal soft  tissue calcifications. The bony structures are unremarkable. There is a spina bifida occulta at S1.  IMPRESSION: Mildly increased stool burden could reflect clinical constipation but no acute intra-abdominal abnormality is demonstrated.   Electronically Signed   By: David  [place]az[place]   On: 04/17/2014 12:26   Assessment and Plan :   1. Constipation, unspecified constipation type - Advised increased water and fiber intake, patient declined rectal exam to evaluate for hemorrhoids. States that if symptoms do not improve with dietary changes, stool softener and laxative, she will return for further evaluation - senna-docusate (SENOKOT-S) 8.6-50 MG per tablet; Take 1 tablet by mouth 2 (two) times daily.  Dispense: 30 tablet; Refill: 1  2. Generalized abdominal pain 3. Menstrual period late - Likely related to mild-moderate constipation - CBC, abd x-ray reassuring for r/o infectious process - POCT urine pregnancy was negative, may be start of 1 irregular cycle - GC/Chlamydia Probe Amp pending - Comprehensive metabolic panel pending  4. High risk sexual behavior - GC/Chlamydia Probe Amp pending   Wallis BambergMario Sharada Albornoz, PA-C [facility] [phone] 04/17/2014 1:56 PM

## 2014-04-17 NOTE — Patient Instructions (Addendum)
- Start taking 2 stool softener doses a day. If your stool becomes loose, then back down to 1 dose. If your stools are still loose then come off the medicine all together. - You may also use Miralax, a laxative to help with constipation. - Increase your fiber intake including salads and vegetables, drink at least 64 ounces of water daily.   Constipation Constipation is when a person has fewer than three bowel movements a week, has difficulty having a bowel movement, or has stools that are dry, hard, or larger than normal. As people grow older, constipation is more common. If you try to fix constipation with medicines that make you have a bowel movement (laxatives), the problem may get worse. Long-term laxative use may cause the muscles of the colon to become weak. A low-fiber diet, not taking in enough fluids, and taking certain medicines may make constipation worse.  CAUSES   Certain medicines, such as antidepressants, pain medicine, iron supplements, antacids, and water pills.   Certain diseases, such as diabetes, irritable bowel syndrome (IBS), thyroid disease, or depression.   Not drinking enough water.   Not eating enough fiber-rich foods.   Stress or travel.   Lack of physical activity or exercise.   Ignoring the urge to have a bowel movement.   Using laxatives too much.  SIGNS AND SYMPTOMS   Having fewer than three bowel movements a week.   Straining to have a bowel movement.   Having stools that are hard, dry, or larger than normal.   Feeling full or bloated.   Pain in the lower abdomen.   Not feeling relief after having a bowel movement.  DIAGNOSIS  Your health care provider will take a medical history and perform a physical exam. Further testing may be done for severe constipation. Some tests may include:  A barium enema X-ray to examine your rectum, colon, and, sometimes, your small intestine.   A sigmoidoscopy to examine your lower colon.   A  colonoscopy to examine your entire colon. TREATMENT  Treatment will depend on the severity of your constipation and what is causing it. Some dietary treatments include drinking more fluids and eating more fiber-rich foods. Lifestyle treatments may include regular exercise. If these diet and lifestyle recommendations do not help, your health care provider may recommend taking over-the-counter laxative medicines to help you have bowel movements. Prescription medicines may be prescribed if over-the-counter medicines do not work.  HOME CARE INSTRUCTIONS   Eat foods that have a lot of fiber, such as fruits, vegetables, whole grains, and beans.  Limit foods high in fat and processed sugars, such as french fries, hamburgers, cookies, candies, and soda.   A fiber supplement may be added to your diet if you cannot get enough fiber from foods.   Drink enough fluids to keep your urine clear or pale yellow.   Exercise regularly or as directed by your health care provider.   Go to the restroom when you have the urge to go. Do not hold it.   Only take over-the-counter or prescription medicines as directed by your health care provider. Do not take other medicines for constipation without talking to your health care provider first.  SEEK IMMEDIATE MEDICAL CARE IF:   You have bright red blood in your stool.   Your constipation lasts for more than 4 days or gets worse.   You have abdominal or rectal pain.   You have thin, pencil-like stools.   You have unexplained weight loss. MAKE  SURE YOU:   Understand these instructions.  Will watch your condition.  Will get help right away if you are not doing well or get worse. Document Released: 01/16/2004 Document Revised: 04/24/2013 Document Reviewed: 01/29/2013 [facility] Patient Information 2015 [place], [place]. This information is not intended to replace advice given to you by your health care provider. Make sure you discuss any questions you  have with your health care provider.

## 2014-04-18 ENCOUNTER — Encounter: Payer: Self-pay | Admitting: Urgent Care

## 2014-04-18 LAB — COMPREHENSIVE METABOLIC PANEL
ALK PHOS: 53 U/L (ref 39–117)
AST: 15 U/L (ref 0–37)
Albumin: 4.2 g/dL (ref 3.5–5.2)
BUN: 8 mg/dL (ref 6–23)
CO2: 24 mEq/L (ref 19–32)
Calcium: 9.1 mg/dL (ref 8.4–10.5)
Chloride: 104 mEq/L (ref 96–112)
Creat: 0.65 mg/dL (ref 0.50–1.10)
GLUCOSE: 78 mg/dL (ref 70–99)
Potassium: 3.9 mEq/L (ref 3.5–5.3)
SODIUM: 139 meq/L (ref 135–145)
Total Bilirubin: 0.5 mg/dL (ref 0.2–1.2)
Total Protein: 7.2 g/dL (ref 6.0–8.3)

## 2014-04-18 LAB — GC/CHLAMYDIA PROBE AMP
CT Probe RNA: NEGATIVE
GC Probe RNA: NEGATIVE

## 2014-06-13 ENCOUNTER — Ambulatory Visit (INDEPENDENT_AMBULATORY_CARE_PROVIDER_SITE_OTHER): Payer: BLUE CROSS/BLUE SHIELD | Admitting: Urgent Care

## 2014-06-13 VITALS — BP 90/56 | HR 88 | Temp 98.4°F | Resp 16 | Ht 61.0 in | Wt 96.0 lb

## 2014-06-13 DIAGNOSIS — A5900 Urogenital trichomoniasis, unspecified: Secondary | ICD-10-CM

## 2014-06-13 DIAGNOSIS — N898 Other specified noninflammatory disorders of vagina: Secondary | ICD-10-CM

## 2014-06-13 DIAGNOSIS — Z0000 Encounter for general adult medical examination without abnormal findings: Secondary | ICD-10-CM

## 2014-06-13 DIAGNOSIS — Z7251 High risk heterosexual behavior: Secondary | ICD-10-CM

## 2014-06-13 DIAGNOSIS — R319 Hematuria, unspecified: Secondary | ICD-10-CM

## 2014-06-13 DIAGNOSIS — Z23 Encounter for immunization: Secondary | ICD-10-CM

## 2014-06-13 DIAGNOSIS — Z8742 Personal history of other diseases of the female genital tract: Secondary | ICD-10-CM

## 2014-06-13 DIAGNOSIS — N398 Other specified disorders of urinary system: Secondary | ICD-10-CM

## 2014-06-13 DIAGNOSIS — A5901 Trichomonal vulvovaginitis: Secondary | ICD-10-CM

## 2014-06-13 LAB — POCT UA - MICROSCOPIC ONLY
Bacteria, U Microscopic: NEGATIVE
Casts, Ur, LPF, POC: NEGATIVE
Crystals, Ur, HPF, POC: NEGATIVE
Mucus, UA: NEGATIVE
RBC, URINE, MICROSCOPIC: NEGATIVE
Yeast, UA: NEGATIVE

## 2014-06-13 LAB — POCT URINALYSIS DIPSTICK
Bilirubin, UA: NEGATIVE
Glucose, UA: NEGATIVE
Ketones, UA: NEGATIVE
Leukocytes, UA: NEGATIVE
Nitrite, UA: NEGATIVE
SPEC GRAV UA: 1.02
Urobilinogen, UA: 0.2
pH, UA: 7

## 2014-06-13 LAB — COMPREHENSIVE METABOLIC PANEL
ALK PHOS: 50 U/L (ref 39–117)
ALT: 10 U/L (ref 0–35)
AST: 14 U/L (ref 0–37)
Albumin: 4.4 g/dL (ref 3.5–5.2)
BILIRUBIN TOTAL: 0.7 mg/dL (ref 0.2–1.2)
BUN: 15 mg/dL (ref 6–23)
CO2: 29 meq/L (ref 19–32)
CREATININE: 0.87 mg/dL (ref 0.50–1.10)
Calcium: 9.1 mg/dL (ref 8.4–10.5)
Chloride: 102 mEq/L (ref 96–112)
Glucose, Bld: 62 mg/dL — ABNORMAL LOW (ref 70–99)
Potassium: 3.9 mEq/L (ref 3.5–5.3)
Sodium: 138 mEq/L (ref 135–145)
Total Protein: 7.6 g/dL (ref 6.0–8.3)

## 2014-06-13 LAB — POCT CBC
Granulocyte percent: 58.7 %G (ref 37–80)
HCT, POC: 46.2 % (ref 37.7–47.9)
Hemoglobin: 14.5 g/dL (ref 12.2–16.2)
Lymph, poc: 2.8 (ref 0.6–3.4)
MCH: 26.8 pg — AB (ref 27–31.2)
MCHC: 31.4 g/dL — AB (ref 31.8–35.4)
MCV: 85.5 fL (ref 80–97)
MID (CBC): 0.5 (ref 0–0.9)
MPV: 8.7 fL (ref 0–99.8)
PLATELET COUNT, POC: 194 10*3/uL (ref 142–424)
POC Granulocyte: 4.6 (ref 2–6.9)
POC LYMPH PERCENT: 35.5 %L (ref 10–50)
POC MID %: 5.8 % (ref 0–12)
RBC: 5.41 M/uL (ref 4.04–5.48)
RDW, POC: 15.5 %
WBC: 7.8 10*3/uL (ref 4.6–10.2)

## 2014-06-13 LAB — POCT WET PREP WITH KOH
BACTERIA WET PREP HPF POC: NEGATIVE
KOH Prep POC: POSITIVE
RBC Wet Prep HPF POC: NEGATIVE
TRICHOMONAS UA: POSITIVE
WBC Wet Prep HPF POC: NEGATIVE
YEAST WET PREP PER HPF POC: NEGATIVE

## 2014-06-13 MED ORDER — METRONIDAZOLE 500 MG PO TABS: 500.0000 mg | ORAL_TABLET | Freq: Two times a day (BID) | ORAL | Status: AC

## 2014-06-13 NOTE — Progress Notes (Signed)
MRN: [id number]  Subjective:   Mr. Kathy Johnston is a 24 y.o. female presenting for annual physical exam and anogenital bleeding.  Medical care team includes:  PCP: No PCP Per Patient,  Vision: Does not wear glasses, contacts, no blurred vision. No eye care. Dental: No dental care. 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 Patient was advised to follow up in 1 year.  [name] has LGSIL (low grade squamous intraepithelial lesion) on Pap smear on her problem list.  Patient is not taking any medications currently. No Known Allergies  Concerns:   Anogenital bleeding - reports 2 day history of anogenital bleeding while wiping after urinating, suspects that she may have scratched herself. Patient can feel a scab in between vagina and anal area. Area is non-tender, not draining, or bleeding since she first noticed. Denies pelvic pain, vaginal discharge, dysuria, hematuria, constipation, history of hemorrhoids, bloody stool, hard stool. Patient was seen 04/2014 for possible pregnancy, tested negative for pregnancy, patient opted to do uri-probe for gonorrhea and chlamydia, also negative. Denies any other aggravating or relieving factors, no other questions or concerns.  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 Diet consists mainly of asian food, tries to eat healthy. She exercises regularly, twice a week. Does not smoke cigarettes, drinks ~3 servings of liquor or ~3 beers.  Immunizations: Not interested in flu shot, has never had one, last TDAP Kathy Johnston  Kathy Johnston  has a past medical history of Vaginal Pap smear, abnormal (2014). She also  has past surgical history that includes No past surgeries.   ROS  Objective:   Vitals: BP 90/56 mmHg  Pulse 88  Temp(Src) 98.4 F (36.9 C) (Oral)  Resp 16  Ht 5\' 1"  (1.549 m)  Wt 96 lb (43.545 kg)  BMI 18.15 kg/m2  SpO2  100%  LMP 05/15/2014  Physical Exam  Constitutional: She is oriented to person, place, and time and well-developed, well-nourished, and in no distress.  HENT:  TM's intact bilaterally, no effusions or erythema. Nasal turbinates patent, pink and moist. No sinus tenderness. Oropharynx clear, mucous membranes moist.  Eyes: Conjunctivae and EOM are normal. Pupils are equal, round, and reactive to light. Right eye exhibits no discharge. Left eye exhibits no discharge. No scleral icterus.  Neck: Normal range of motion. Neck supple. No thyromegaly present.  Cardiovascular: Normal rate, regular rhythm, normal heart sounds and intact distal pulses.  Exam reveals no gallop.   No murmur heard. Pulmonary/Chest: Effort normal and breath sounds normal. No stridor. No respiratory distress. She has no wheezes. She has no rales. She exhibits no tenderness.  Abdominal: Soft. Bowel sounds are normal. She exhibits no distension and no mass. There is no tenderness. There is no rebound and no guarding.  Genitourinary: Rectal exam shows laceration (~0.5cm linear abrasion over perineum). Rectal exam shows no external hemorrhoid, no internal hemorrhoid, no fissure, no mass and no tenderness. Uterus is deviated (slightly deviated to the left). Uterus is not enlarged and not tender. Cervix exhibits no motion tenderness, no lesion and no tenderness. Right adnexum displays no deviation, no mass and no tenderness. Left adnexum displays no deviation, no mass and no tenderness. Vulva exhibits no erythema, no exudate, no lesion, no rash and no tenderness. Vagina exhibits normal mucosa, no exudate, no lesion and no rugosity. No vaginal discharge found.  Vaginal canal with copious thick white discharge.  Musculoskeletal: Normal range of motion.  She exhibits no edema or tenderness.  Lymphadenopathy:    She has no cervical adenopathy.  Neurological: She is alert and oriented to person, place, and time. She has normal reflexes.    Skin: Skin is warm and dry. No rash noted. No erythema.  Psychiatric: Mood and affect normal.    Results for orders placed or performed in visit on 06/13/14 (from the past 24 hour(s))  Comprehensive metabolic panel     Status: Abnormal   Collection Time: 06/13/14  4:45 PM  Result Value Ref Range   Sodium 138 135 - 145 mEq/L   Potassium 3.9 3.5 - 5.3 mEq/L   Chloride 102 96 - 112 mEq/L   CO2 29 19 - 32 mEq/L   Glucose, Bld 62 (L) 70 - 99 mg/dL   BUN 15 6 - 23 mg/dL   Creat 1.61 0.96 - 0.45 mg/dL   Total Bilirubin 0.7 0.2 - 1.2 mg/dL   Alkaline Phosphatase 50 39 - 117 U/L   AST 14 0 - 37 U/L   ALT 10 0 - 35 U/L   Total Protein 7.6 6.0 - 8.3 g/dL   Albumin 4.4 3.5 - 5.2 g/dL   Calcium 9.1 8.4 - 40.9 mg/dL   Narrative   Performed at:  [facility]                [address], [address]                [place], [place] [address]  POCT CBC     Status: Abnormal   Collection Time: 06/13/14  5:00 PM  Result Value Ref Range   WBC 7.8 4.6 - 10.2 K/uL   Lymph, poc 2.8 0.6 - 3.4   POC LYMPH PERCENT 35.5 10 - 50 %L   MID (cbc) 0.5 0 - 0.9   POC MID % 5.8 0 - 12 %M   POC Granulocyte 4.6 2 - 6.9   Granulocyte percent 58.7 37 - 80 %G   RBC 5.41 4.04 - 5.48 M/uL   Hemoglobin 14.5 12.2 - 16.2 g/dL   HCT, POC 29.5 62.1 - 47.9 %   MCV 85.5 80 - 97 fL   MCH, POC 26.8 (A) 27 - 31.2 pg   MCHC 31.4 (A) 31.8 - 35.4 g/dL   RDW, POC 30.8 %   Platelet Count, POC 194 142 - 424 K/uL   MPV 8.7 0 - 99.8 fL  POCT urinalysis dipstick     Status: None   Collection Time: 06/13/14  5:35 PM  Result Value Ref Range   Color, UA yellow    Clarity, UA clear    Glucose, UA neg    Bilirubin, UA neg    Ketones, UA neg    Spec Grav, UA 1.020    Blood, UA trace-lysed    pH, UA 7.0    Protein, UA trace    Urobilinogen, UA 0.2    Nitrite, UA neg    Leukocytes, UA Negative   POCT Wet Prep with KOH     Status: None   Collection Time: 06/13/14  5:36 PM  Result Value Ref Range   Trichomonas, UA  Positive    Clue Cells Wet Prep HPF POC 3-7    Epithelial Wet Prep HPF POC 2-5    Yeast Wet Prep HPF POC neg    Bacteria Wet Prep HPF POC neg    RBC Wet Prep HPF POC neg    WBC Wet Prep HPF POC neg  KOH Prep POC Positive   POCT UA - Microscopic Only     Status: None   Collection Time: 06/13/14  5:37 PM  Result Value Ref Range   WBC, Ur, HPF, POC 0-2    RBC, urine, microscopic neg    Bacteria, U Microscopic neg    Mucus, UA neg    Epithelial cells, urine per micros 0-4    Crystals, Ur, HPF, POC neg    Casts, Ur, LPF, POC neg    Yeast, UA neg    Assessment and Plan :   1. Annual physical exam - Discussed healthy lifestyle, diet, exercise, preventative care, vaccinations, and addressed patient's concerns.  - Patient is medically stable and generally healthy, plan for repeat annual exam in 1 year.  2. History of abnormal cervical Pap smear 3. Bleeding from the genitourinary system 4. Vaginal discharge 5. Trichomonas vaginalis (TV) infection 6. High risk sexual behavior - Pap smear pending, discussed safe sex practices 04/2014 and reiterated safe sex at today's visit. Recommended that patient undergo Gardasil vaccination, patient does not like immunizations but will consider this. - Rx for Trichomonas infection - Will follow up with lab results in a few days by phone  7. Need for Tdap vaccination - Tdap vaccine greater than or equal to 7yo IM   Wallis Bamberg, PA-C [facility] [phone] 06/14/2014 10:50 AM

## 2014-06-13 NOTE — Patient Instructions (Addendum)
Trichomoniasis Trichomoniasis is an infection caused by an organism called Trichomonas. The infection can affect both women and men. In women, the outer female genitalia and the vagina are affected. In men, the penis is mainly affected, but the prostate and other reproductive organs can also be involved. Trichomoniasis is a sexually transmitted infection (STI) and is most often passed to another person through sexual contact.  RISK FACTORS  Having unprotected sexual intercourse.  Having sexual intercourse with an infected partner. SIGNS AND SYMPTOMS  Symptoms of trichomoniasis in women include:  Abnormal gray-green frothy vaginal discharge.  Itching and irritation of the vagina.  Itching and irritation of the area outside the vagina. Symptoms of trichomoniasis in men include:   Penile discharge with or without pain.  Pain during urination. This results from inflammation of the urethra. DIAGNOSIS  Trichomoniasis may be found during a Pap test or physical exam. Your health care provider may use one of the following methods to help diagnose this infection:  Examining vaginal discharge under a microscope. For men, urethral discharge would be examined.  Testing the pH of the vagina with a test tape.  Using a vaginal swab test that checks for the Trichomonas organism. A test is available that provides results within a few minutes.  Doing a culture test for the organism. This is not usually needed. TREATMENT   You may be given medicine to fight the infection. Women should inform their health care provider if they could be or are pregnant. Some medicines used to treat the infection should not be taken during pregnancy.  Your health care provider may recommend over-the-counter medicines or creams to decrease itching or irritation.  Your sexual partner will need to be treated if infected. HOME CARE INSTRUCTIONS   Take medicines only as directed by your health care provider.  Take  over-the-counter medicine for itching or irritation as directed by your health care provider.  Do not have sexual intercourse while you have the infection.  Women should not douche or wear tampons while they have the infection.  Discuss your infection with your partner. Your partner may have gotten the infection from you, or you may have gotten it from your partner.  Have your sex partner get examined and treated if necessary.  Practice safe, informed, and protected sex.  See your health care provider for other STI testing. SEEK MEDICAL CARE IF:   You still have symptoms after you finish your medicine.  You develop abdominal pain.  You have pain when you urinate.  You have bleeding after sexual intercourse.  You develop a rash.  Your medicine makes you sick or makes you throw up (vomit). MAKE SURE YOU:  Understand these instructions.  Will watch your condition.  Will get help right away if you are not doing well or get worse. Document Released: 10/13/2000 Document Revised: 09/03/2013 Document Reviewed: 01/29/2013 [facility] Patient Information 2015 [place], [place]. This information is not intended to replace advice given to you by your health care provider. Make sure you discuss any questions you have with your health care provider.    HPV Vaccine Gardasil (Human Papillomavirus): What You Need to Know 1. What is HPV? Genital human papillomavirus (HPV) is the most common sexually transmitted virus in the [place]. More than half of sexually active men and women are infected with HPV at some time in their lives. About 20 million Americans are currently infected, and about 6 million more get infected each year. HPV is usually spread through sexual contact.  Most HPV infections don't cause any symptoms, and go away on their own. But HPV can cause cervical cancer in women. Cervical cancer is the 2nd leading cause of cancer deaths among women around the world. In the [place], about 12,000 women get cervical cancer every year and about 4,000 are expected to die from it. HPV is also associated with several less common cancers, such as vaginal and vulvar cancers in women, and anal and oropharyngeal (back of the throat, including base of tongue and tonsils) cancers in both men and women. HPV can also cause genital warts and warts in the throat. There is no cure for HPV infection, but some of the problems it causes can be treated. 2. HPV vaccine: Why get vaccinated? The HPV vaccine you are getting is one of two vaccines that can be given to prevent HPV. It may be given to both males and females.  This vaccine can prevent most cases of cervical cancer in females, if it is given before exposure to the virus. In addition, it can prevent vaginal and vulvar cancer in females, and genital warts and anal cancer in both males and females. Protection from HPV vaccine is expected to be long-lasting. But vaccination is not a substitute for cervical cancer screening. Women should still get regular Pap tests. 3. Who should get this HPV vaccine and when? HPV vaccine is given as a 3-dose series  1st Dose: Now  2nd Dose: 1 to 2 months after Dose 1  3rd Dose: 6 months after Dose 1 Additional (booster) doses are not recommended. Routine vaccination  This HPV vaccine is recommended for girls and boys 6011 or 25 years of age. It may be given starting at age 25. Why is HPV vaccine recommended at 1511 or 25 years of age?  HPV infection is easily acquired, even with only one sex partner. That is why it is important to get HPV vaccine before any sexual contact takes place. Also, response to the vaccine is better at this age than at older ages. Catch-up vaccination This vaccine is recommended for the following people who have not completed the 3-dose series:   Females 13 through 25 years of age.  Males 13 through 25 years of age. This vaccine may be given to men 22 through 25 years of  age who have not completed the 3-dose series. It is recommended for men through age 25 who have sex with men or whose immune system is weakened because of HIV infection, other illness, or medications.  HPV vaccine may be given at the same time as other vaccines. 4. Some people should not get HPV vaccine or should wait.  Anyone who has ever had a life-threatening allergic reaction to any component of HPV vaccine, or to a previous dose of HPV vaccine, should not get the vaccine. Tell your doctor if the person getting vaccinated has any severe allergies, including an allergy to yeast.  HPV vaccine is not recommended for pregnant women. However, receiving HPV vaccine when pregnant is not a reason to consider terminating the pregnancy. Women who are breast feeding may get the vaccine.  People who are mildly ill when a dose of HPV is planned can still be vaccinated. People with a moderate or severe illness should wait until they are better. 5. What are the risks from this vaccine? This HPV vaccine has been used in the [place] and around the world for about six years and has been very safe. However, any medicine could possibly  cause a serious problem, such as a severe allergic reaction. The risk of any vaccine causing a serious injury, or death, is extremely small. Life-threatening allergic reactions from vaccines are very rare. If they do occur, it would be within a few minutes to a few hours after the vaccination. Several mild to moderate problems are known to occur with this HPV vaccine. These do not last long and go away on their own.  Reactions in the arm where the shot was given:  Pain (about 8 people in 10)  Redness or swelling (about 1 person in 4)  Fever:  Mild (100 F) (about 1 person in 10)  Moderate (102 F) (about 1 person in 49)  Other problems:  Headache (about 1 person in 3)  Fainting: Brief fainting spells and related symptoms (such as jerking movements) can happen after any  medical procedure, including vaccination. Sitting or lying down for about 15 minutes after a vaccination can help prevent fainting and injuries caused by falls. Tell your doctor if the patient feels dizzy or light-headed, or has vision changes or ringing in the ears.  Like all vaccines, HPV vaccines will continue to be monitored for unusual or severe problems. 6. What if there is a serious reaction? What should I look for?  Look for anything that concerns you, such as signs of a severe allergic reaction, very high fever, or behavior changes. Signs of a severe allergic reaction can include hives, swelling of the face and throat, difficulty breathing, a fast heartbeat, dizziness, and weakness. These would start a few minutes to a few hours after the vaccination.  What should I do?  If you think it is a severe allergic reaction or other emergency that can't wait, call 9-1-1 or get the person to the nearest hospital. Otherwise, call your doctor.  Afterward, the reaction should be reported to the Vaccine Adverse Event Reporting System (VAERS). Your doctor might file this report, or you can do it yourself through the VAERS web site at www.vaers.LAgents.no, or by calling 1-(856)798-7796. VAERS is only for reporting reactions. They do not give medical advice. 7. The National Vaccine Injury Compensation Program  The [employer] Vaccine Injury Compensation Program (VICP) is a federal program that was created to compensate people who may have been injured by certain vaccines.  Persons who believe they may have been injured by a vaccine can learn about the program and about filing a claim by calling 1-773-175-1309 or visiting the VICP website at SpiritualWord.at. 8. How can I learn more?  Ask your doctor.  Call your local or state health department.  Contact the Centers for Disease Control and Prevention (CDC):  Call (716)796-1245 (1-800-CDC-INFO)  or  Visit CDC's website at  PicCapture.uy CDC Human Papillomavirus (HPV) Gardasil (Interim) 09/17/11 Document Released: 02/14/2006 Document Revised: 09/03/2013 Document Reviewed: 05/31/2013 ExitCare Patient Information 2015 [place], [place]. This information is not intended to replace advice given to you by your health care provider. Make sure you discuss any questions you have with your health care provider.

## 2014-06-17 LAB — PAP IG, CT-NG, RFX HPV ASCU
CHLAMYDIA PROBE AMP: NEGATIVE
GC Probe Amp: NEGATIVE

## 2014-06-19 ENCOUNTER — Telehealth: Payer: Self-pay | Admitting: Urgent Care

## 2014-06-19 LAB — HUMAN PAPILLOMAVIRUS, HIGH RISK: HPV DNA HIGH RISK: NOT DETECTED

## 2014-06-19 NOTE — Telephone Encounter (Signed)
Discussed results and follow up. Patient agreed to repeat pap smear in 1 year. She has cleared the HPV infection. She is looking into getting the HPV vaccine through her work but if not will try to come to [facility] for this. Otherwise, all other labs normal. Repeat annual exam in 1 year.  Wallis BambergMario Thandiwe Siragusa, PA-C [facility] [phone] 06/19/2014  4:28 PM

## 2014-08-20 ENCOUNTER — Telehealth: Payer: Self-pay

## 2014-08-20 NOTE — Telephone Encounter (Signed)
Pt wants Kathy Johnston to give her a CB regarding medication she was  prescribed the  last time she was here. She was very vague and wouldn't  tell me more. Please advise at  [phone]

## 2014-08-20 NOTE — Telephone Encounter (Signed)
Spoke with pt, advised to RTC. Pt understood. 

## 2014-08-20 NOTE — Telephone Encounter (Signed)
Pt was treated in Feb and she was told if she has any more sx's to call back. She states she is having vaginal itching and a discharge. She stated wanting something for yeast infection, but I wondered if she needed to be seen first.

## 2014-08-20 NOTE — Telephone Encounter (Signed)
Genitourinary infections can range from yeast, STI, viral or accumulation of normal flora and there's no way of telling what her diagnosis would be by phone. Patient should come in for a visit to determine diagnosis and receive appropriate treatment.

## 2015-11-09 IMAGING — US US OB TRANSVAGINAL
1 series · 13 of 28 positions shown · non-contrast
Comparison: None.

CLINICAL DATA: Vaginal bleeding and pelvic pain.

EXAM:
OBSTETRIC <14 WK US AND TRANSVAGINAL OB US
TECHNIQUE: Both transabdominal and transvaginal ultrasound examinations were
performed for complete evaluation of the gestation as well as the
maternal uterus, adnexal regions, and pelvic cul-de-sac.
Transvaginal technique was performed to assess early pregnancy.

[Series 1: us ob comp less 14 wks · 13 of 43 slices shown]
[im 2/43]
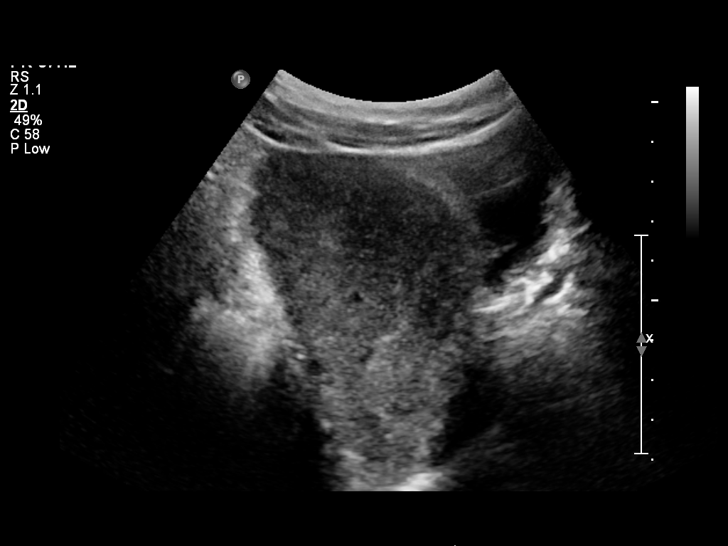
[im 5/43]
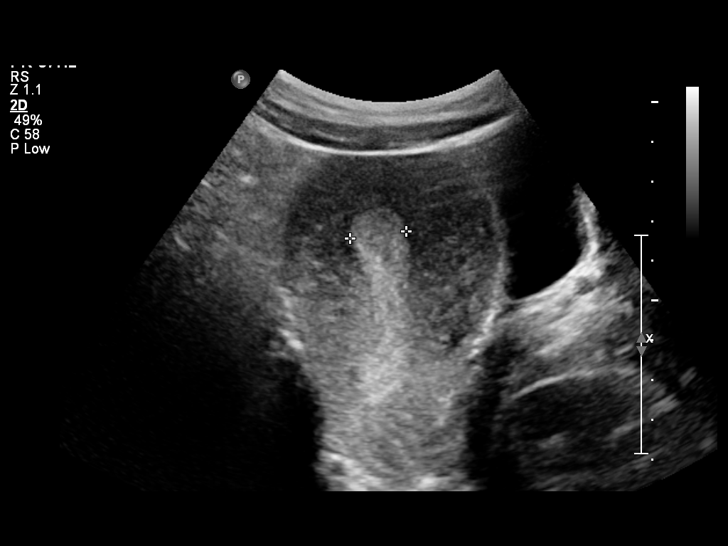
[im 8/43]
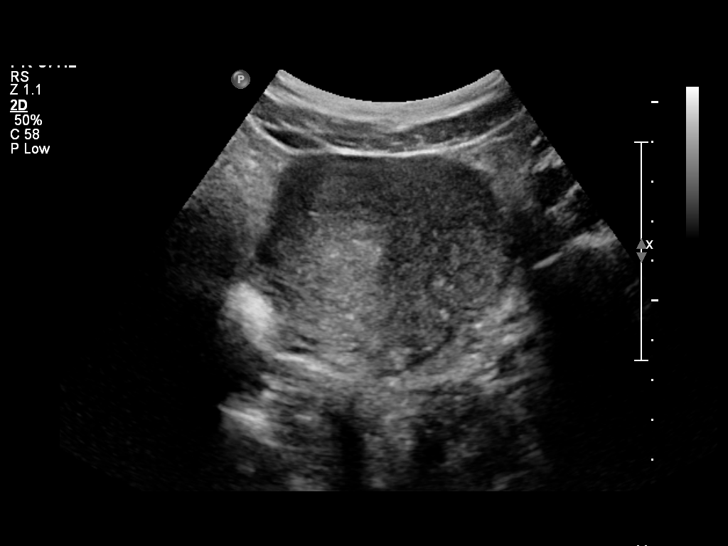
[im 11/43]
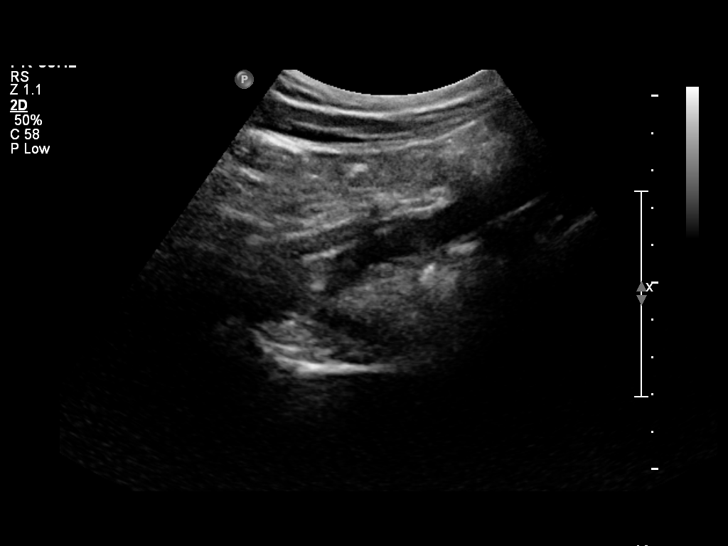
[im 15/43]
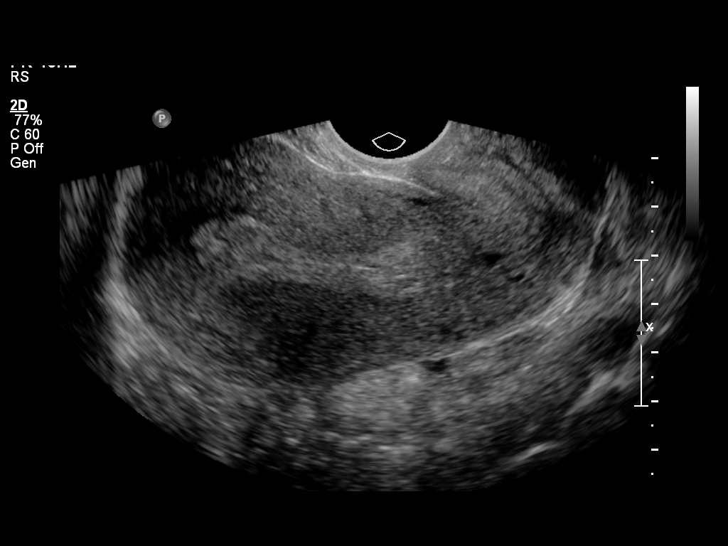
[im 18/43]
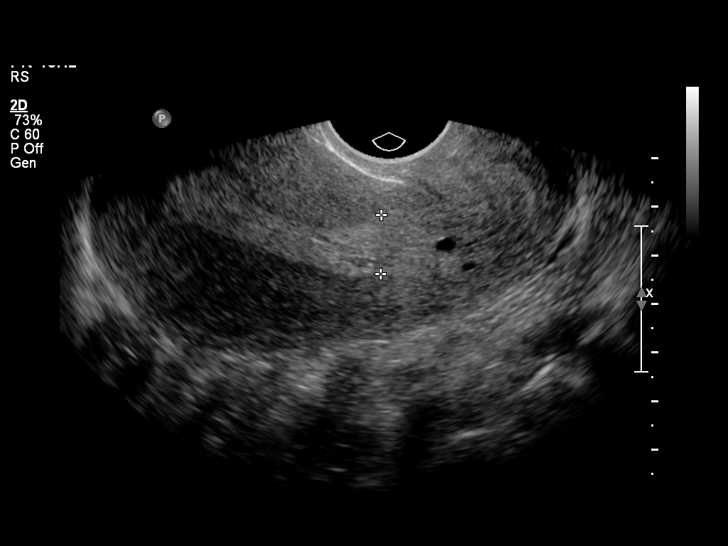
[im 22/43]
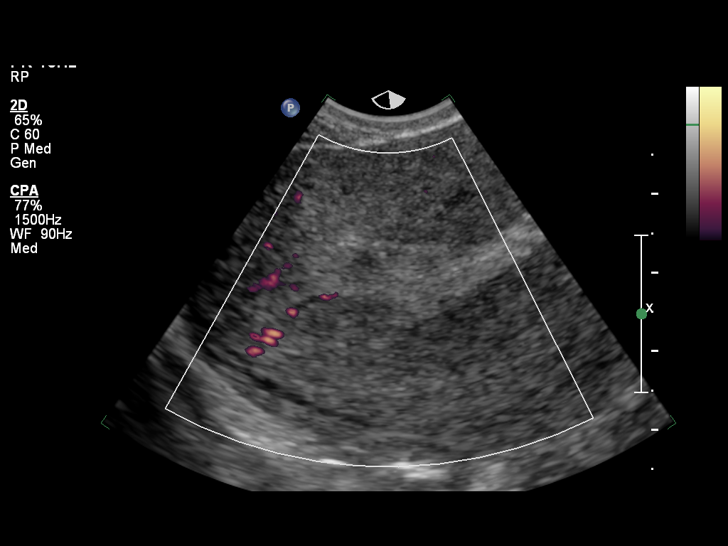
[im 25/43]
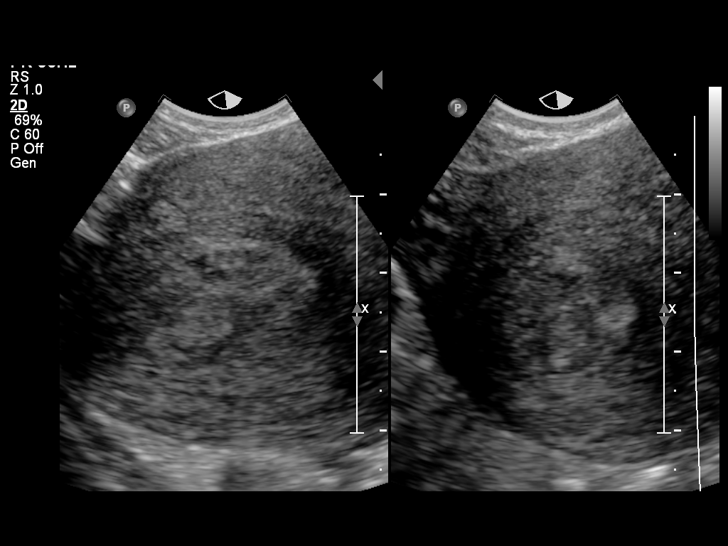
[im 29/43]
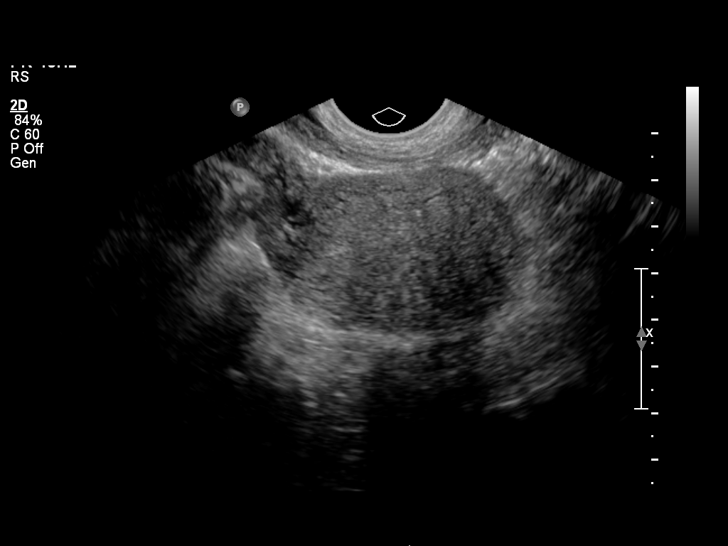
[im 32/43]
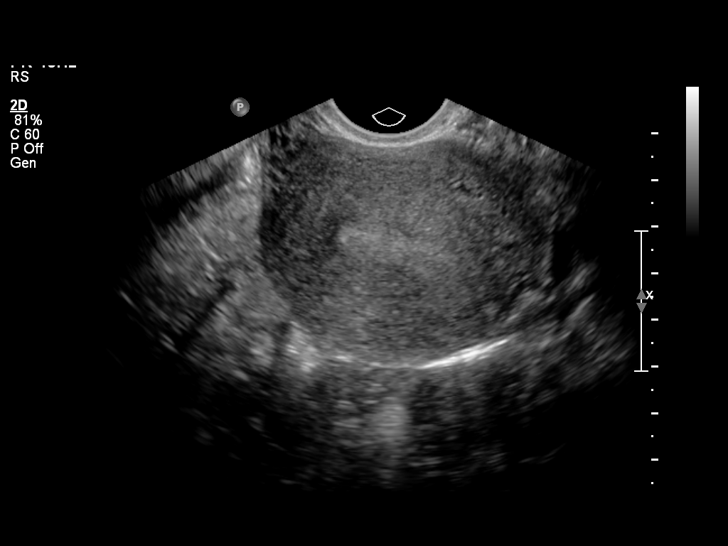
[im 35/43]
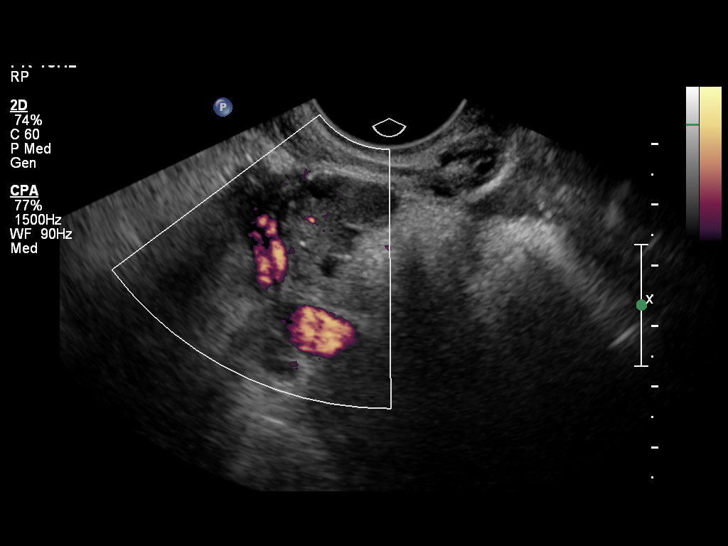
[im 38/43]
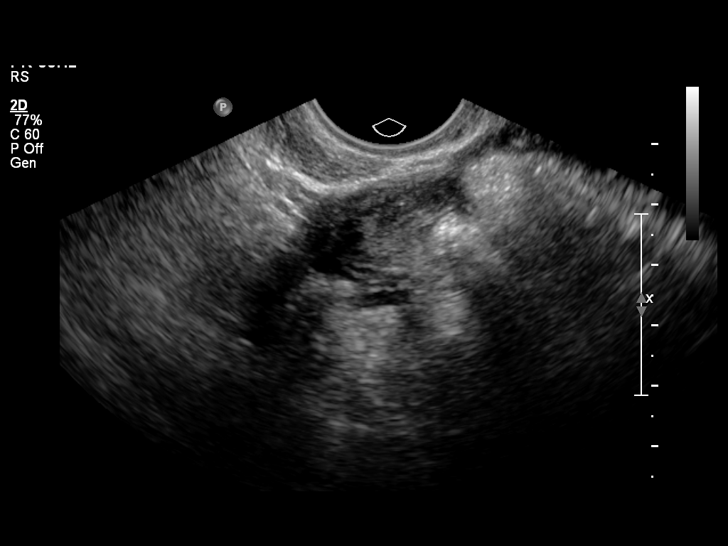
[im 41/43]
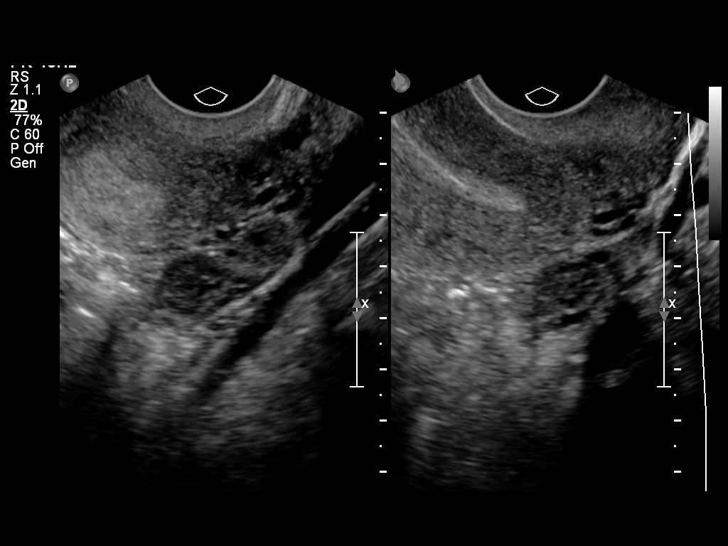

[13 of 28 positions shown; findings below may reference images not displayed]

FINDINGS: Intrauterine gestational sac: None seen.

Yolk sac:  N/A

Embryo:  N/A

Maternal uterus/adnexae: The endometrial echo complex appears
somewhat thickened and heterogeneous, with clumping of echogenic
tissue at the fundus. This may simply reflect clot, as no definite
associated blood flow is seen on limited Doppler evaluation.

The ovaries are unremarkable in appearance. The right ovary measures
1.9 x 1.6 x 1.8 cm, while the left ovary measures 3.0 x 1.1 x
cm. No suspicious adnexal masses are seen; there is no evidence for
ovarian torsion.

No free fluid is seen within the pelvic cul-de-sac.
IMPRESSION: No intrauterine gestational sac seen. The endometrial echo complex
appears somewhat thickened and heterogeneous, with clumping of
echogenic tissue at the fundus. This may simply reflect clot, as no
associated blood flow is seen. If the patient's symptoms persist,
further evaluation could be considered.

Would follow-up quantitative beta HCG levels; if levels continue to
rise, follow-up pelvic ultrasound would be warranted.
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
## 2016-10-05 IMAGING — CR DG ABDOMEN 1V
1 series · 1 of 1 positions shown · non-contrast
Comparison: None.

CLINICAL DATA: Generalized abdominal pain.

EXAM:
ABDOMEN - 1 VIEW

[AP]
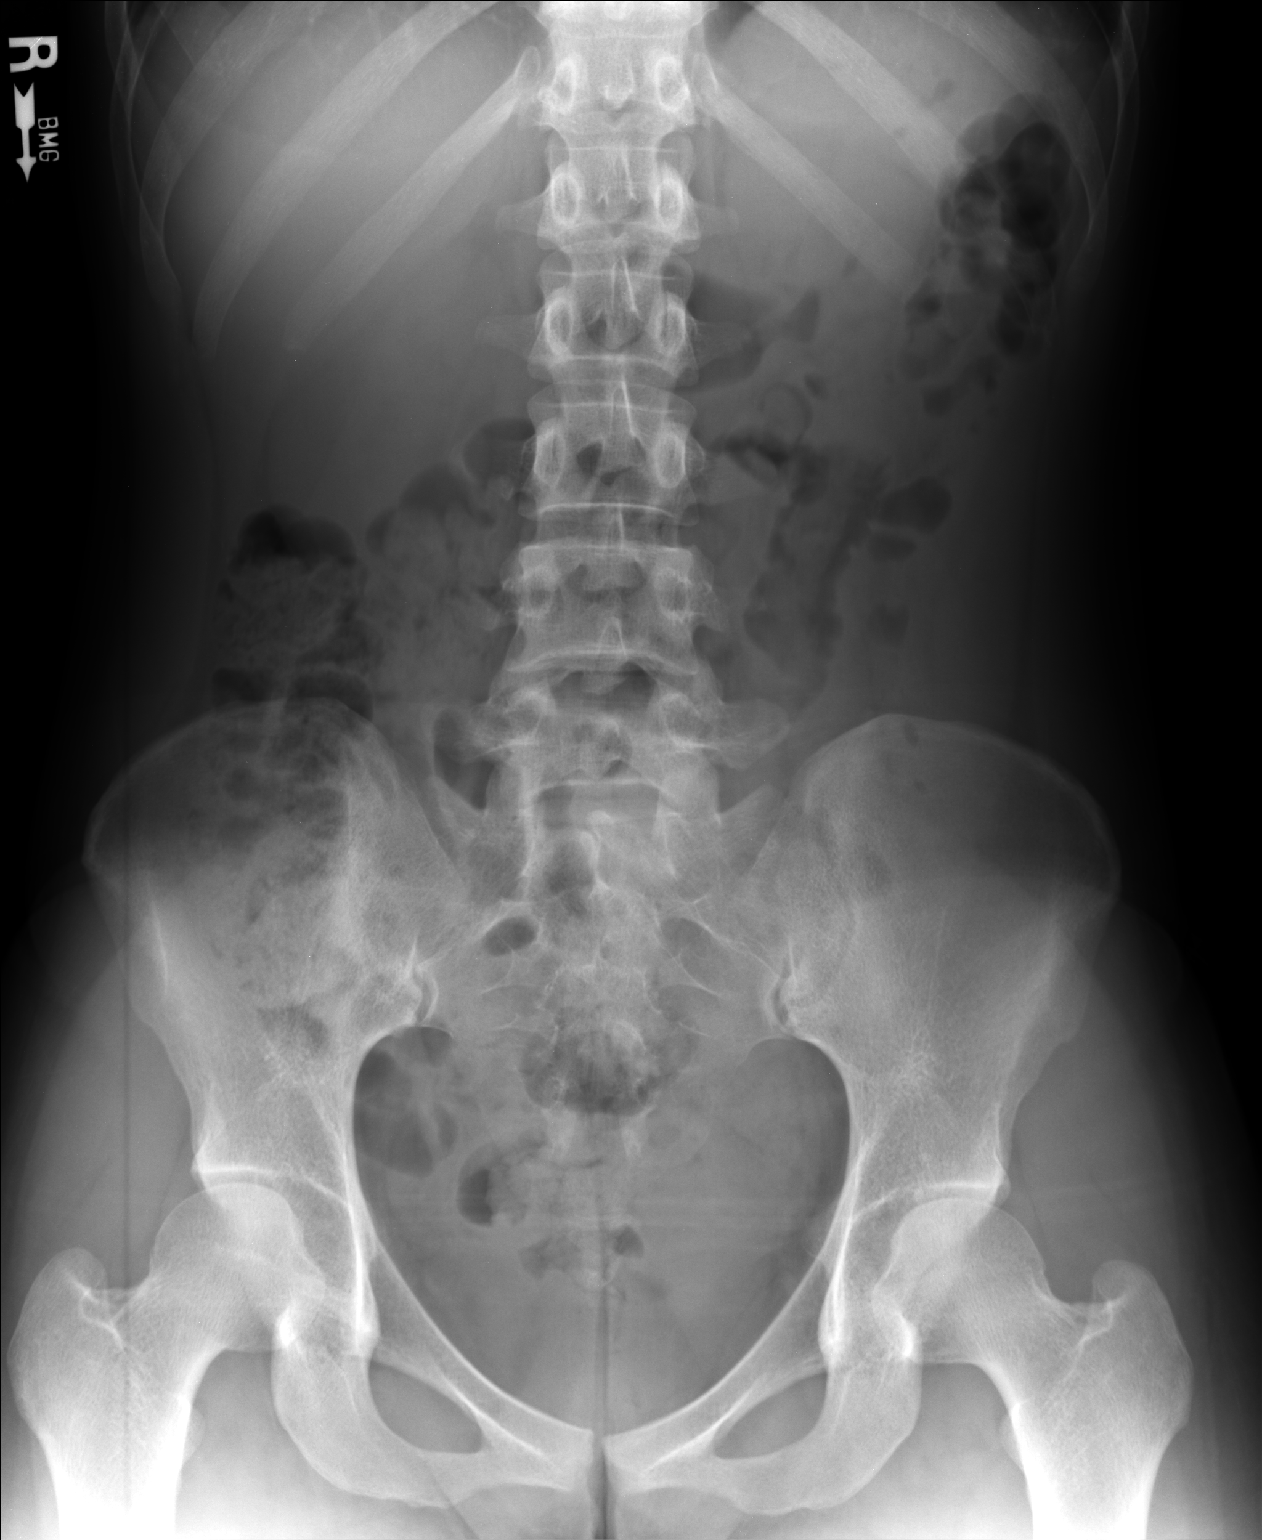

[1 of 1 positions shown; findings below may reference images not displayed]

FINDINGS: The bowel gas pattern is unremarkable. A moderate stool burden in
the colon is present. No free extraluminal gas collections are
demonstrated. There are no abnormal soft tissue calcifications. The
bony structures are unremarkable. There is a spina bifida occulta at
S1.
IMPRESSION: Mildly increased stool burden could reflect clinical constipation
but no acute intra-abdominal abnormality is demonstrated.
